# Patient Record
Sex: Male | Born: 1940 | Race: White | Hispanic: No | Marital: Married | Smoking: Current every day smoker
Health system: Southern US, Community
[De-identification: ages and names within clinical notes are randomized; demographics above are authoritative.]

## PROBLEM LIST (undated history)

## (undated) DIAGNOSIS — F17210 Nicotine dependence, cigarettes, uncomplicated: Secondary | ICD-10-CM

## (undated) DIAGNOSIS — Z972 Presence of dental prosthetic device (complete) (partial): Secondary | ICD-10-CM

## (undated) DIAGNOSIS — F329 Major depressive disorder, single episode, unspecified: Secondary | ICD-10-CM

## (undated) DIAGNOSIS — M549 Dorsalgia, unspecified: Secondary | ICD-10-CM

## (undated) DIAGNOSIS — F419 Anxiety disorder, unspecified: Secondary | ICD-10-CM

## (undated) DIAGNOSIS — K219 Gastro-esophageal reflux disease without esophagitis: Secondary | ICD-10-CM

## (undated) DIAGNOSIS — K08109 Complete loss of teeth, unspecified cause, unspecified class: Secondary | ICD-10-CM

## (undated) DIAGNOSIS — M1990 Unspecified osteoarthritis, unspecified site: Secondary | ICD-10-CM

## (undated) DIAGNOSIS — C801 Malignant (primary) neoplasm, unspecified: Secondary | ICD-10-CM

## (undated) DIAGNOSIS — E785 Hyperlipidemia, unspecified: Secondary | ICD-10-CM

## (undated) DIAGNOSIS — F32A Depression, unspecified: Secondary | ICD-10-CM

## (undated) HISTORY — PX: ORIF ANKLE FRACTURE: SUR919

---

## 1984-02-02 HISTORY — PX: BACK SURGERY: SHX140

## 1998-04-04 ENCOUNTER — Encounter: Admission: RE | Admit: 1998-04-04 | Discharge: 1998-07-03 | Payer: Self-pay | Admitting: Anesthesiology

## 1999-02-02 HISTORY — PX: CERVICAL FUSION: SHX112

## 1999-02-02 HISTORY — PX: CARDIAC CATHETERIZATION: SHX172

## 1999-07-02 ENCOUNTER — Encounter: Payer: Self-pay | Admitting: Neurosurgery

## 1999-07-06 ENCOUNTER — Inpatient Hospital Stay (HOSPITAL_COMMUNITY): Admission: RE | Admit: 1999-07-06 | Discharge: 1999-07-07 | Payer: Self-pay | Admitting: Neurosurgery

## 1999-07-06 ENCOUNTER — Encounter: Payer: Self-pay | Admitting: Neurosurgery

## 1999-07-29 ENCOUNTER — Encounter: Admission: RE | Admit: 1999-07-29 | Discharge: 1999-07-29 | Payer: Self-pay | Admitting: Neurosurgery

## 1999-07-29 ENCOUNTER — Encounter: Payer: Self-pay | Admitting: Neurosurgery

## 1999-09-04 ENCOUNTER — Encounter: Payer: Self-pay | Admitting: Neurosurgery

## 1999-09-04 ENCOUNTER — Encounter: Admission: RE | Admit: 1999-09-04 | Discharge: 1999-09-04 | Payer: Self-pay | Admitting: Neurosurgery

## 1999-12-21 ENCOUNTER — Encounter: Admission: RE | Admit: 1999-12-21 | Discharge: 1999-12-21 | Payer: Self-pay | Admitting: Neurosurgery

## 1999-12-21 ENCOUNTER — Encounter: Payer: Self-pay | Admitting: Neurosurgery

## 1999-12-30 ENCOUNTER — Inpatient Hospital Stay (HOSPITAL_COMMUNITY): Admission: EM | Admit: 1999-12-30 | Discharge: 1999-12-31 | Payer: Self-pay | Admitting: Emergency Medicine

## 2000-01-02 ENCOUNTER — Encounter: Payer: Self-pay | Admitting: Neurosurgery

## 2000-01-02 ENCOUNTER — Ambulatory Visit (HOSPITAL_COMMUNITY): Admission: RE | Admit: 2000-01-02 | Discharge: 2000-01-02 | Payer: Self-pay | Admitting: Neurosurgery

## 2000-01-03 ENCOUNTER — Encounter: Payer: Self-pay | Admitting: Neurosurgery

## 2000-03-01 ENCOUNTER — Inpatient Hospital Stay (HOSPITAL_COMMUNITY): Admission: EM | Admit: 2000-03-01 | Discharge: 2000-03-01 | Payer: Self-pay | Admitting: Emergency Medicine

## 2000-03-01 ENCOUNTER — Encounter: Payer: Self-pay | Admitting: Emergency Medicine

## 2001-03-03 ENCOUNTER — Emergency Department (HOSPITAL_COMMUNITY): Admission: EM | Admit: 2001-03-03 | Discharge: 2001-03-03 | Payer: Self-pay

## 2001-09-21 ENCOUNTER — Encounter: Payer: Self-pay | Admitting: Family Medicine

## 2001-09-21 ENCOUNTER — Ambulatory Visit (HOSPITAL_COMMUNITY): Admission: RE | Admit: 2001-09-21 | Discharge: 2001-09-21 | Payer: Self-pay | Admitting: Family Medicine

## 2001-11-17 ENCOUNTER — Ambulatory Visit (HOSPITAL_COMMUNITY): Admission: RE | Admit: 2001-11-17 | Discharge: 2001-11-17 | Payer: Self-pay | Admitting: Family Medicine

## 2001-11-17 ENCOUNTER — Encounter: Payer: Self-pay | Admitting: Family Medicine

## 2001-11-23 ENCOUNTER — Encounter: Payer: Self-pay | Admitting: Family Medicine

## 2001-11-23 ENCOUNTER — Ambulatory Visit (HOSPITAL_COMMUNITY): Admission: RE | Admit: 2001-11-23 | Discharge: 2001-11-23 | Payer: Self-pay | Admitting: Family Medicine

## 2002-04-05 ENCOUNTER — Encounter: Payer: Self-pay | Admitting: Gastroenterology

## 2002-04-05 ENCOUNTER — Ambulatory Visit (HOSPITAL_COMMUNITY): Admission: RE | Admit: 2002-04-05 | Discharge: 2002-04-05 | Payer: Self-pay | Admitting: Gastroenterology

## 2003-08-25 ENCOUNTER — Emergency Department (HOSPITAL_COMMUNITY): Admission: EM | Admit: 2003-08-25 | Discharge: 2003-08-25 | Payer: Self-pay | Admitting: Emergency Medicine

## 2003-08-26 ENCOUNTER — Encounter (HOSPITAL_COMMUNITY): Admission: RE | Admit: 2003-08-26 | Discharge: 2003-09-25 | Payer: Self-pay | Admitting: Orthopaedic Surgery

## 2004-02-28 ENCOUNTER — Ambulatory Visit (HOSPITAL_COMMUNITY): Admission: RE | Admit: 2004-02-28 | Discharge: 2004-02-28 | Payer: Self-pay | Admitting: Preventative Medicine

## 2004-10-30 ENCOUNTER — Ambulatory Visit (HOSPITAL_COMMUNITY): Admission: RE | Admit: 2004-10-30 | Discharge: 2004-10-30 | Payer: Self-pay | Admitting: Family Medicine

## 2008-05-06 ENCOUNTER — Emergency Department (HOSPITAL_COMMUNITY): Admission: EM | Admit: 2008-05-06 | Discharge: 2008-05-06 | Payer: Self-pay | Admitting: Emergency Medicine

## 2010-02-21 ENCOUNTER — Encounter: Payer: Self-pay | Admitting: Preventative Medicine
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
## 2010-06-19 NOTE — Cardiovascular Report (Signed)
[place]. [facility]  Patient:    Craig Green, Craig Green                     MRN: [id number] Proc. Date: 12/31/99 Adm. Date:  [id number] Attending:  Berry, Jonathan [place] CC:         [facility]  Gareth Morgan, M.D., [place], [place]  [facility], [phone] [place]. [address] [place], [place] [address]   Cardiac Catheterization  CLINICAL HISTORY:  [name] is a 70 year old married white male, father of two, grandfather to two.  He is referred by Dr. Sudie Bailey for chest pain on December 28, 1999.  The patients profile is positive for a 45 pack/year tobacco abuse, but otherwise is negative.  He works Chemical engineer houses.  He describes left precordial pain radiating to his left arm, for approximately five to six months.  He was scheduled to have a Cardiolite stress test while he was seen in the [facility] Emergency Room last night; was transferred ______ for further evaluation.  He presents now for diagnostic coronary arteriography.  DESCRIPTION OF PROCEDURE:  The patient was brought to the second floor [place]. [facility] cardiac catheterization lab in the postabsorptive state.  He was premedicated with p.o. Valium and IV Versed. His right groin was prepped and shaved in the usual sterile fashion. Xylocaine 1% was used for local anesthesia.  A 6-French sheath was inserted into the right femoral artery using standard Seldinger technique. Then 6-French right and left Judkins diagnostic catheters, as well as a 6-French pigtail catheter were used for selective coronary angiography and left ventriculography respectively.  Omnipaque dye was used throughout the entirety of the case.  Retrograde aortic and left ventricular pullback pressures were recorded.  HEMODYNAMICS: 1. Aortic pressure:  130/79. 2. Left ventricular pressure:  131/18.  SELECTIVE CORONARY ANGIOGRAPHY: 1. LEFT MAIN:  Normal. 2. LEFT  ANTERIOR DESCENDING ARTERY:  Normal. 3. LEFT CIRCUMFLEX:  Normal. 4. RIGHT CORONARY ARTERY:  Dominant and normal.  LEFT VENTRICULOGRAPHY:  RAO left ventriculogram was performed using 25 cc of Omnipaque dye at 12 cc/sec.  The overall LVEF, calculated by the foreshortening technique and automated edge detection algorithms, was 64%.  No focal wall motion abnormalities.  IMPRESSION:  [name] has normal coronary arteries and normal left ventricular function.  His right femoral arterial puncture site was hemostatically sealed with a Perclose device, after angiographically assessing the puncture site to be well above the ______ profunda bifurcation. Hemostasis was obtained.  The patient left the lab in stable condition. Dr. Gareth Morgan was notified of these results.  The patient will be discharged home later today and will follow up with me in the office in one week for groin check. DD:  12/31/99 TD:  12/31/99 Job: [id number]

## 2010-06-19 NOTE — Op Note (Signed)
[place]. [facility]  Patient:    Craig Green, Craig Green                     MRN: [id number] Proc. Date: 07/06/99 Adm. Date:  [id number] Disc. Date: [id number] Attending:  Barton Fanny CC:         Hewitt Shorts, M.D.                           Operative Report  PREOPERATIVE DIAGNOSIS:  C3-4 cervical degenerative disk disease, spondylosis and disk herniation with resulting radiculopathy.  POSTOPERATIVE DIAGNOSIS:  C3-4 cervical degenerative disk disease, spondylosis and disk herniation with resulting radiculopathy.  OPERATION PERFORMED:  C3-4 anterior cervical diskectomy and arthrodesis with iliac crest allograft and Synthes cervical plating.  SURGEON:  Hewitt Shorts, M.D.  ASSISTANT:  Alanson Aly. Roxan Hockey, M.D.  ANESTHESIA:  General endotracheal.  INDICATIONS FOR PROCEDURE:  The patient is a 70 year old man who presented with right cervical radiculopathy, who was found to have degenerative changes at C3-4, worse than at C6-7 and it was felt that most likely the majority of the symptoms were arising from the C3-4 level and therefore, decision was made to proceed with a single level anterior cervical diskectomy and arthrodesis.  DESCRIPTION OF PROCEDURE:  The patient was brought to the operating room and placed under general endotracheal anesthesia.  The patient was placed in 10 pounds of halter traction and the neck was prepped with Betadine soap and solution and draped in a sterile fashion.  A horizontal incision was made in the left side of the neck.  The line of the incision was infiltrated with local anesthetic with epinephrine.  Incision was made with a Shaw scalpel with a temperature at 120 and dissection was carried down to the subcutaneous tissues and platysma.  Dissection was then carried out through an avascular plane leaving the sternocleidomastoid, carotid artery and jugular vein laterally and trachea and esophagus medially.  The  ventral aspect of the vertebral column was identified and localizing x-ray taken.  The C3-4 intervertebral disk space was identified.  Diskectomy was begun with an incision of the annulus and continued with microcurets and pituitary rongeurs. The cartilaginous end plates of the C3 and C4 vertebrae were removed using a Midas Rex drill with an A-2 bur and then the microscope was draped and brought into the field to provide additional magnification, illumination and visualization.  The remainder of the procedure was performed using microdissection technique.  There was extensive posterior osteophytic overgrowth that was carefully removed using a Midas Rex drill with an A-2 bur and a Kerrison punch with a thin footplate.  The 2 mm punch was used to remove the remaining osteophytic overgrowth and foraminotomy was performed bilaterally.  The posterior longitudinal ligament was thickened and this was removed as well.  In the end, good decompression of the thecal sac and nerve roots bilaterally was achieved.  We then selected a wedge of iliac crest allograft.  It was cut and shaped using an oscillating saw and placed into the intervertebral disk space.  The height of the graft anteriorly was 8 mm.  The graft was countersunk and anterior osteophytic overgrowth was removed and then we selected a 16 mm Synthes cervical plate.  It was secured to the vertebra with a pair of 4.0 x 16 mm self drilling cancellous screws.  An x-ray was taken, the graft was in good position.  The plate  and screws were in good position and then locking screws were placed.  Once all four locking screws were secured, the wound was irrigated with bacitracin solution and checked for hemostasis and we proceeded with closure.  The platysma was reapproximated with interrupted inverted 2-0 undyed Vicryl sutures, the subcutaneous and subcuticular layer were closed with interrupted inverted 3-0 undyed Vicryl sutures and the skin edges  were approximated with Steri-Strips.  The wound was dressed with adaptic and sterile gauze.  The patient tolerated the procedure well.  Estimated blood loss for this procedure was 100 cc.  Sponge, needle and instrument counts were correct.  Following surgery the patient who had been taken out of traction once the bone graft was placed, was reversed from anesthetic to be extubated and was transferred to recovery room for further care. DD:  07/06/99 TD:  07/08/99 Job: [id number]

## 2012-04-04 ENCOUNTER — Encounter (HOSPITAL_BASED_OUTPATIENT_CLINIC_OR_DEPARTMENT_OTHER): Payer: Self-pay | Admitting: *Deleted

## 2012-04-04 ENCOUNTER — Other Ambulatory Visit: Payer: Self-pay | Admitting: Orthopedic Surgery
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
## 2012-04-05 ENCOUNTER — Ambulatory Visit (HOSPITAL_BASED_OUTPATIENT_CLINIC_OR_DEPARTMENT_OTHER)
Admission: RE | Admit: 2012-04-05 | Discharge: 2012-04-05 | Disposition: A | Discharge status: Home / Self Care | Payer: Medicare Other | Source: Ambulatory Visit | Attending: Orthopedic Surgery | Admitting: Orthopedic Surgery

## 2012-04-05 ENCOUNTER — Ambulatory Visit (HOSPITAL_COMMUNITY): Payer: Medicare Other

## 2012-04-05 ENCOUNTER — Encounter (HOSPITAL_BASED_OUTPATIENT_CLINIC_OR_DEPARTMENT_OTHER)
Admission: RE | Disposition: A | Discharge status: Home / Self Care | Payer: Self-pay | Source: Ambulatory Visit | Attending: Orthopedic Surgery

## 2012-04-05 ENCOUNTER — Encounter (HOSPITAL_BASED_OUTPATIENT_CLINIC_OR_DEPARTMENT_OTHER): Payer: Self-pay | Admitting: Anesthesiology

## 2012-04-05 ENCOUNTER — Ambulatory Visit (HOSPITAL_BASED_OUTPATIENT_CLINIC_OR_DEPARTMENT_OTHER): Payer: Medicare Other | Admitting: Anesthesiology

## 2012-04-05 ENCOUNTER — Encounter (HOSPITAL_BASED_OUTPATIENT_CLINIC_OR_DEPARTMENT_OTHER): Payer: Self-pay | Admitting: *Deleted

## 2012-04-05 DIAGNOSIS — E785 Hyperlipidemia, unspecified: Secondary | ICD-10-CM | POA: Insufficient documentation

## 2012-04-05 DIAGNOSIS — M12549 Traumatic arthropathy, unspecified hand: Secondary | ICD-10-CM | POA: Insufficient documentation

## 2012-04-05 DIAGNOSIS — F17200 Nicotine dependence, unspecified, uncomplicated: Secondary | ICD-10-CM | POA: Insufficient documentation

## 2012-04-05 DIAGNOSIS — F329 Major depressive disorder, single episode, unspecified: Secondary | ICD-10-CM | POA: Insufficient documentation

## 2012-04-05 DIAGNOSIS — F411 Generalized anxiety disorder: Secondary | ICD-10-CM | POA: Insufficient documentation

## 2012-04-05 DIAGNOSIS — F3289 Other specified depressive episodes: Secondary | ICD-10-CM | POA: Insufficient documentation

## 2012-04-05 DIAGNOSIS — K219 Gastro-esophageal reflux disease without esophagitis: Secondary | ICD-10-CM | POA: Insufficient documentation

## 2012-04-05 HISTORY — DX: Gastro-esophageal reflux disease without esophagitis: K21.9

## 2012-04-05 HISTORY — DX: Anxiety disorder, unspecified: F41.9

## 2012-04-05 HISTORY — DX: Unspecified osteoarthritis, unspecified site: M19.90

## 2012-04-05 HISTORY — DX: Hyperlipidemia, unspecified: E78.5

## 2012-04-05 HISTORY — DX: Major depressive disorder, single episode, unspecified: F32.9

## 2012-04-05 HISTORY — DX: Complete loss of teeth, unspecified cause, unspecified class: K08.109

## 2012-04-05 HISTORY — DX: Nicotine dependence, cigarettes, uncomplicated: F17.210

## 2012-04-05 HISTORY — DX: Depression, unspecified: F32.A

## 2012-04-05 HISTORY — PX: FINGER ARTHRODESIS: SHX5000

## 2012-04-05 HISTORY — DX: Presence of dental prosthetic device (complete) (partial): Z97.2

## 2012-04-05 SURGERY — FUSION, JOINT, FINGER
Anesthesia: Regional | Site: Thumb | Laterality: Right | Wound class: Clean

## 2012-04-05 MED ORDER — LIDOCAINE HCL (CARDIAC) 20 MG/ML IV SOLN
INTRAVENOUS | Status: DC | PRN
  Administered 2012-04-05: 50 mg via INTRAVENOUS

## 2012-04-05 MED ORDER — LACTATED RINGERS IV SOLN
INTRAVENOUS | Status: DC
Start: 2012-04-05 — End: 2012-04-05
  Administered 2012-04-05 (×3): via INTRAVENOUS

## 2012-04-05 MED ORDER — CEFAZOLIN SODIUM-DEXTROSE 2-3 GM-% IV SOLR
INTRAVENOUS | Status: DC | PRN
  Administered 2012-04-05: 2 g via INTRAVENOUS

## 2012-04-05 MED ORDER — ONDANSETRON HCL 4 MG/2ML IJ SOLN
INTRAMUSCULAR | Status: DC | PRN
  Administered 2012-04-05: 4 mg via INTRAVENOUS

## 2012-04-05 MED ORDER — OXYCODONE HCL 5 MG PO TABS: 5.0000 mg | ORAL_TABLET | Freq: Once | ORAL | Status: DC | PRN

## 2012-04-05 MED ORDER — HYDROMORPHONE HCL PF 1 MG/ML IJ SOLN: 0.2500 mg | INTRAMUSCULAR | Status: DC | PRN

## 2012-04-05 MED ORDER — MIDAZOLAM HCL 2 MG/2ML IJ SOLN: 1.0000 mg | INTRAMUSCULAR | Status: DC | PRN

## 2012-04-05 MED ORDER — FENTANYL CITRATE 0.05 MG/ML IJ SOLN
50.0000 ug | INTRAMUSCULAR | Status: DC | PRN
  Administered 2012-04-05: 50 ug via INTRAVENOUS

## 2012-04-05 MED ORDER — HYDROCODONE-ACETAMINOPHEN 5-325 MG PO TABS: 1.0000 | ORAL_TABLET | ORAL | Status: DC | PRN

## 2012-04-05 MED ORDER — DEXAMETHASONE SODIUM PHOSPHATE 4 MG/ML IJ SOLN
INTRAMUSCULAR | Status: DC | PRN
  Administered 2012-04-05: 10 mg via INTRAVENOUS

## 2012-04-05 MED ORDER — BUPIVACAINE HCL (PF) 0.5 % IJ SOLN
INTRAMUSCULAR | Status: DC | PRN
  Administered 2012-04-05: 10 mL

## 2012-04-05 MED ORDER — BUPIVACAINE-EPINEPHRINE PF 0.5-1:200000 % IJ SOLN
INTRAMUSCULAR | Status: DC | PRN
  Administered 2012-04-05: 30 mL

## 2012-04-05 MED ORDER — PROPOFOL 10 MG/ML IV BOLUS
INTRAVENOUS | Status: DC | PRN
  Administered 2012-04-05: 200 mg via INTRAVENOUS

## 2012-04-05 MED ORDER — OXYCODONE-ACETAMINOPHEN 5-325 MG PO TABS: 1.0000 | ORAL_TABLET | ORAL | Status: DC | PRN

## 2012-04-05 MED ORDER — OXYCODONE HCL 5 MG/5ML PO SOLN: 5.0000 mg | Freq: Once | ORAL | Status: DC | PRN

## 2012-04-05 MED ORDER — HYDROMORPHONE HCL PF 1 MG/ML IJ SOLN: 0.5000 mg | INTRAMUSCULAR | Status: DC | PRN

## 2012-04-05 SURGICAL SUPPLY — 55 items
BANDAGE COBAN STERILE 2 (GAUZE/BANDAGES/DRESSINGS) IMPLANT
BANDAGE GAUZE ELAST BULKY 4 IN (GAUZE/BANDAGES/DRESSINGS) ×4 IMPLANT
BIT DRILL LNG QR ACUTRAK 2 (BIT) ×1 IMPLANT
BIT DRILL MINI LNG ACUTRAK 2 (BIT) IMPLANT
BLADE AVERAGE 25X9 (BLADE) ×1 IMPLANT
BLADE MINI RND TIP GREEN BEAV (BLADE) ×1 IMPLANT
BLADE OSC/SAG .038X5.5 CUT EDG (BLADE) IMPLANT
BLADE SURG 15 STRL LF DISP TIS (BLADE) ×1 IMPLANT
BLADE SURG 15 STRL SS (BLADE) ×4
BNDG CMPR 9X4 STRL LF SNTH (GAUZE/BANDAGES/DRESSINGS) ×1
BNDG COHESIVE 3X5 TAN STRL LF (GAUZE/BANDAGES/DRESSINGS) ×1 IMPLANT
BNDG COHESIVE 4X5 TAN STRL (GAUZE/BANDAGES/DRESSINGS) IMPLANT
BNDG ESMARK 4X9 LF (GAUZE/BANDAGES/DRESSINGS) ×1 IMPLANT
CHLORAPREP W/TINT 26ML (MISCELLANEOUS) ×2 IMPLANT
CLOTH BEACON ORANGE TIMEOUT ST (SAFETY) ×2 IMPLANT
CORDS BIPOLAR (ELECTRODE) ×2 IMPLANT
COVER MAYO STAND STRL (DRAPES) ×2 IMPLANT
COVER TABLE BACK 60X90 (DRAPES) ×2 IMPLANT
CUFF TOURNIQUET SINGLE 18IN (TOURNIQUET CUFF) ×1 IMPLANT
DRAPE C-ARM 42X72 X-RAY (DRAPES) ×1 IMPLANT
DRAPE EXTREMITY T 121X128X90 (DRAPE) ×2 IMPLANT
DRAPE SURG 17X23 STRL (DRAPES) ×2 IMPLANT
DRILL MINI LNG ACUTRAK 2 (BIT) ×2
DRSG EMULSION OIL 3X3 NADH (GAUZE/BANDAGES/DRESSINGS) ×2 IMPLANT
GLOVE BIO SURGEON STRL SZ 6.5 (GLOVE) ×1 IMPLANT
GLOVE BIO SURGEON STRL SZ7.5 (GLOVE) ×2 IMPLANT
GLOVE BIOGEL PI IND STRL 8 (GLOVE) ×1 IMPLANT
GLOVE BIOGEL PI INDICATOR 8 (GLOVE) ×1
GOWN PREVENTION PLUS XLARGE (GOWN DISPOSABLE) ×4 IMPLANT
GUIDEWIRE ORTHO 0.054X7 SS (WIRE) ×1 IMPLANT
GUIDEWIRE ORTHO MINI ACTK .045 (WIRE) ×1 IMPLANT
KWIRE 4.0 X .062IN (WIRE) IMPLANT
NDL HYPO 25X1 1.5 SAFETY (NEEDLE) IMPLANT
NEEDLE HYPO 25X1 1.5 SAFETY (NEEDLE) IMPLANT
NS IRRIG 1000ML POUR BTL (IV SOLUTION) ×2 IMPLANT
PACK BASIN DAY SURGERY FS (CUSTOM PROCEDURE TRAY) ×2 IMPLANT
PAD CAST 4YDX4 CTTN HI CHSV (CAST SUPPLIES) ×1 IMPLANT
PADDING CAST ABS 4INX4YD NS (CAST SUPPLIES) ×1
PADDING CAST ABS COTTON 4X4 ST (CAST SUPPLIES) IMPLANT
PADDING CAST COTTON 4X4 STRL (CAST SUPPLIES) ×2
RUBBERBAND STERILE (MISCELLANEOUS) IMPLANT
SCREW ACUTRAK 2 MINI 20MM (Screw) ×1 IMPLANT
SCREW ACUTRAK 2 STD 34MM (Screw) ×1 IMPLANT
SLING ARM FOAM STRAP LRG (SOFTGOODS) ×1 IMPLANT
SPLINT PLASTER CAST XFAST 3X15 (CAST SUPPLIES) IMPLANT
SPLINT PLASTER XTRA FASTSET 3X (CAST SUPPLIES)
SPONGE GAUZE 4X4 12PLY (GAUZE/BANDAGES/DRESSINGS) ×2 IMPLANT
STOCKINETTE 4X48 STRL (DRAPES) ×2 IMPLANT
SUT ETHIBOND 3-0 V-5 (SUTURE) IMPLANT
SUT STEEL 4 (SUTURE) IMPLANT
SYR BULB 3OZ (MISCELLANEOUS) ×2 IMPLANT
SYRINGE 10CC LL (SYRINGE) IMPLANT
TOWEL OR 17X24 6PK STRL BLUE (TOWEL DISPOSABLE) ×2 IMPLANT
TOWEL OR NON WOVEN STRL DISP B (DISPOSABLE) ×2 IMPLANT
UNDERPAD 30X30 INCONTINENT (UNDERPADS AND DIAPERS) ×2 IMPLANT
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
## 2012-04-05 NOTE — Anesthesia Procedure Notes (Addendum)
Anesthesia Regional Block:  Supraclavicular block  Pre-Anesthetic Checklist: ,, timeout performed, Correct Patient, Correct Site, Correct Laterality, Correct Procedure, Correct Position, site marked, Risks and benefits discussed, pre-op evaluation, post-op pain management  Laterality: Right  Prep: Maximum Sterile Barrier Precautions used and chloraprep       Needles:  Injection technique: Single-shot  Needle Type: Echogenic Stimulator Needle     Needle Length: 5cm 5 cm Needle Gauge: 22 and 22 G    Additional Needles:  Procedures: ultrasound guided (picture in chart) Supraclavicular block Narrative:  Start time: 04/05/2012 8:03 AM End time: 04/05/2012 8:12 AM Injection made incrementally with aspirations every 5 mL. Anesthesiologist: Fitzgerald,MD  Additional Notes: 2% Lidocaine skin wheel. Intercostobrachial block with 10cc of 0.5% Bupivicaine plain.  Supraclavicular block Procedure Name: LMA Insertion Date/Time: 04/05/2012 8:34 AM Performed by: Gar Gibbon Pre-anesthesia Checklist: Patient identified, Emergency Drugs available, Suction available and Patient being monitored Patient Re-evaluated:Patient Re-evaluated prior to inductionOxygen Delivery Method: Circle System Utilized Preoxygenation: Pre-oxygenation with 100% oxygen Intubation Type: IV induction Ventilation: Mask ventilation without difficulty LMA: LMA inserted LMA Size: 4.0 Number of attempts: 1 Airway Equipment and Method: bite block Placement Confirmation: positive ETCO2 Tube secured with: Tape Dental Injury: Teeth and Oropharynx as per pre-operative assessment

## 2012-04-05 NOTE — Transfer of Care (Signed)
Immediate Anesthesia Transfer of Care Note  Patient: Craig Green  Procedure(s) Performed: Procedure(s): RIGHT THUMB METACARPOPHALANGEAL ARTHRODESIS  (Right)  Patient Location: PACU  Anesthesia Type:GA combined with regional for post-op pain  Level of Consciousness: awake, oriented and patient cooperative  Airway & Oxygen Therapy: Patient Spontanous Breathing and Patient connected to face mask oxygen  Post-op Assessment: Report given to PACU RN and Post -op Vital signs reviewed and stable  Post vital signs: Reviewed and stable  Complications: No apparent anesthesia complications

## 2012-04-05 NOTE — Op Note (Signed)
04/05/2012  7:06 AM  PATIENT:  Craig Green  72 y.o. male  PRE-OPERATIVE DIAGNOSIS:  Severe Right thumb MPJ Post-traumatic arthritis  POST-OPERATIVE DIAGNOSIS:  Same  PROCEDURE:  RIght thumb MPJ arthrodesis  SURGEON: Cliffton Asters. Janee Morn, MD  PHYSICIAN ASSISTANT: None  ANESTHESIA:  regional and general  SPECIMENS:  None  DRAINS:   None  PREOPERATIVE INDICATIONS:  Craig Green is a  72 y.o. male with a diagnosis of severe right thumb MPJ posttraumatic arthritis who failed conservative measures and elected for surgical management.    The risks benefits and alternatives were discussed with the patient preoperatively including but not limited to the risks of infection, bleeding, nerve injury, cardiopulmonary complications, the need for revision surgery, among others, and the patient verbalized understanding and consented to proceed.  OPERATIVE IMPLANTS: 1 mini Acutrak screw, length 30, and one standard Acutrak screw, length 34  OPERATIVE FINDINGS: Severe posttraumatic arthritis of the right thumb MPJ with volar and ulnar subluxation of the proximal phalanx  OPERATIVE PROCEDURE:  After receiving a regional block, the patient was escorted to the operative theatre and placed in a supine position.  General anesthesia was a Optician, dispensing. A surgical "time-out" was performed during which the planned procedure, proposed operative site, and the correct patient identity were compared to the operative consent and agreement confirmed by the circulating nurse according to current facility policy.  Prophylactic antibodies were administered.  Following application of a tourniquet to the operative extremity, the exposed skin was prepped with Chloraprep and draped in the usual sterile fashion.  The limb was exsanguinated with an Esmarch bandage and the tourniquet inflated to approximately higher than systolic BP.  A 2 limb zigzag incision was marked over the dorsum of the MP joint with its base  ulnarly and an incision was made sharply with a scalpel. The flap was elevated with care to protect and preserve the crossing cutaneous nerve structures. 1 more easily identifiable cutaneous nerve was rather large and retracted radially. The dorsal hood was split longitudinally on the radial side of the EPB tendon. The apparatus was reflected ulnarly. The joint capsule was entered longitudinally and flaps elevated subperiosteally radially and ulnarly. Osteophytes about the head of the metacarpal were resected with a rongeur. The collaterals were freed from the metacarpal and the volar plate split. This allowed for reduction of the joint. The joint surfaces were then shaped with a saw with care to use dripping saline to reduce dermal insult. Once the bone was adequately shaped, it was again copiously irrigated and the surfaces reapproximated. The attempt was made to place diffusion in very slight flexion of 10 to 15. In a position of fusion the thumb tip rested on the index and long fingertips. This was secured with a guidepin from the standard Acutrak screw set. A second guidepin was placed in a crossing fashion. It was from the mini Acutrak set. The appropriate length was measured and a #30 the Accu-Chek screw was placed. Over the first K wire, it was measured to be 3434 length standard Acutrak screw was placed. Apposition at the arthrodesis site seemed extremely tight and it was very stable.  Final fluoroscopic images were obtained. The wound is irrigated and the extensor apparatus was reapproximated with a running 3-0 Ethibond suture. Tourniquet was released and additional hemostasis obtained with direct pressure and bipolar cautery and the skin was closed with 4-0 Vicryl Rapide horizontal mattress suture. A splint dressing with a thumb spica plaster component hallway to the  thumb tip was applied and the patient was awakened and taken to recovery in stable condition, breathing spontaneously.  DISPOSITION:  Patient was discharged with a prescription for oxycodone 5 mg tabs, 1-2 tabs every 4 hours prn quantity 80. Patient will be discharged home today with typical postop instructions returning in 10-15 days for reevaluation to include x-rays of the right thumb out of the splint and conversion to a short arm thumb spica cast all the way to the thumb tip.

## 2012-04-05 NOTE — Progress Notes (Signed)
Assisted Dr. Fitzgerald with right, ultrasound guided, supraclavicular block. Side rails up, monitors on throughout procedure. See vital signs in flow sheet. Tolerated Procedure well. 

## 2012-04-05 NOTE — Anesthesia Postprocedure Evaluation (Signed)
  Anesthesia Post-op Note  Patient: Craig Green  Procedure(s) Performed: Procedure(s): RIGHT THUMB METACARPOPHALANGEAL ARTHRODESIS  (Right)  Patient Location: PACU  Anesthesia Type:GA combined with regional for post-op pain  Level of Consciousness: awake and alert   Airway and Oxygen Therapy: Patient Spontanous Breathing  Post-op Pain: none  Post-op Assessment: Post-op Vital signs reviewed, Patient's Cardiovascular Status Stable, Respiratory Function Stable, Patent Airway and No signs of Nausea or vomiting  Post-op Vital Signs: Reviewed and stable  Complications: No apparent anesthesia complications

## 2012-04-05 NOTE — Anesthesia Preprocedure Evaluation (Addendum)
Anesthesia Evaluation  Patient identified by MRN, date of birth, ID band Patient awake    Reviewed: Allergy & Precautions, H&P , NPO status , Patient's Chart, lab work & pertinent test results  Airway Mallampati: III TM Distance: >3 FB Neck ROM: Full    Dental no notable dental hx. (+) Edentulous Upper, Edentulous Lower and Dental Advisory Given   Pulmonary neg pulmonary ROS,  breath sounds clear to auscultation  Pulmonary exam normal       Cardiovascular negative cardio ROS  Rhythm:Regular Rate:Normal     Neuro/Psych negative neurological ROS  negative psych ROS   GI/Hepatic Neg liver ROS, GERD-  Medicated and Controlled,  Endo/Other  negative endocrine ROS  Renal/GU negative Renal ROS  negative genitourinary   Musculoskeletal   Abdominal   Peds  Hematology negative hematology ROS (+)   Anesthesia Other Findings   Reproductive/Obstetrics negative OB ROS                          Anesthesia Physical Anesthesia Plan  ASA: II  Anesthesia Plan: General and Regional   Post-op Pain Management:    Induction: Intravenous  Airway Management Planned: LMA  Additional Equipment:   Intra-op Plan:   Post-operative Plan: Extubation in OR  Informed Consent: I have reviewed the patients History and Physical, chart, labs and discussed the procedure including the risks, benefits and alternatives for the proposed anesthesia with the patient or authorized representative who has indicated his/her understanding and acceptance.   Dental advisory given  Plan Discussed with: CRNA and Surgeon  Anesthesia Plan Comments:        Anesthesia Quick Evaluation

## 2012-04-06 ENCOUNTER — Encounter (HOSPITAL_BASED_OUTPATIENT_CLINIC_OR_DEPARTMENT_OTHER): Payer: Self-pay | Admitting: Orthopedic Surgery

## 2013-04-11 ENCOUNTER — Encounter (HOSPITAL_COMMUNITY): Payer: Self-pay

## 2013-04-11 ENCOUNTER — Encounter (HOSPITAL_COMMUNITY)
Admission: RE | Admit: 2013-04-11 | Discharge: 2013-04-11 | Disposition: A | Discharge status: Home / Self Care | Payer: Medicare PPO | Source: Ambulatory Visit | Attending: Urology | Admitting: Urology

## 2013-04-11 ENCOUNTER — Other Ambulatory Visit: Payer: Self-pay

## 2013-04-11 DIAGNOSIS — Z01812 Encounter for preprocedural laboratory examination: Secondary | ICD-10-CM | POA: Insufficient documentation

## 2013-04-11 DIAGNOSIS — Z01810 Encounter for preprocedural cardiovascular examination: Secondary | ICD-10-CM | POA: Insufficient documentation

## 2013-04-11 LAB — BASIC METABOLIC PANEL
BUN: 13 mg/dL (ref 6–23)
CHLORIDE: 102 meq/L (ref 96–112)
CO2: 26 mEq/L (ref 19–32)
Calcium: 9.7 mg/dL (ref 8.4–10.5)
Creatinine, Ser: 0.67 mg/dL (ref 0.50–1.35)
GLUCOSE: 104 mg/dL — AB (ref 70–99)
POTASSIUM: 4.9 meq/L (ref 3.7–5.3)
SODIUM: 139 meq/L (ref 137–147)

## 2013-04-11 LAB — HEMOGLOBIN AND HEMATOCRIT, BLOOD
HCT: 40.5 % (ref 39.0–52.0)
HEMOGLOBIN: 14 g/dL (ref 13.0–17.0)

## 2013-04-11 NOTE — Patient Instructions (Signed)
LUISCARLOS KACZMARCZYK  04/11/2013   Your procedure is scheduled on:  04/12/2013  Report to [facility] at  800  AM.  Call this number if you have problems the morning of surgery: [phone]   Remember:   Do not eat food or drink liquids after midnight.   Take these medicines the morning of surgery with A SIP OF WATER:  Xanax, prozac, prilosec   Do not wear jewelry, make-up or nail polish.  Do not wear lotions, powders, or perfumes.   Do not shave 48 hours prior to surgery. Men may shave face and neck.  Do not bring valuables to the hospital.  [facility] is not responsible for any belongings or valuables.               Contacts, dentures or bridgework may not be worn into surgery.  Leave suitcase in the car. After surgery it may be brought to your room.  For patients admitted to the hospital, discharge time is determined by your  treatment team.               Patients discharged the day of surgery will not be allowed to drive home.  Name and phone number of your driver: family  Special Instructions: Shower using CHG 2 nights before surgery and the night before surgery.  If you shower the day of surgery use CHG.  Use special wash - you have one bottle of CHG for all showers.  You should use approximately 1/3 of the bottle for each shower.   Please read over the following fact sheets that you were given: Pain Booklet, Coughing and Deep Breathing, Surgical Site Infection Prevention, Anesthesia Post-op Instructions and Care and Recovery After Surgery Biopsy Care After These instructions give you information on caring for yourself after your procedure. Your doctor may also give you more specific instructions. Call your doctor if you have any problems or questions after your procedure. HOME CARE   Return to your normal diet and activities as told by your doctor.  Change your bandages (dressings) as told by your doctor. If skin glue (adhesive) was used, it will peel off in 7  days.  Only take medicines as told by your doctor.  Ask your doctor when you can bathe and get your wound wet. GET HELP RIGHT AWAY IF:  You see more than a small spot of blood coming from the wound.  You have redness, puffiness (swelling), or pain.  You see yellowish-white fluid (pus) coming from the wound.  You have a fever.  You notice a bad smell coming from the wound or bandage.  You have a rash, trouble breathing, or any allergy problems. MAKE SURE YOU:   Understand these instructions.  Will watch your condition.  Will get help right away if you are not doing well or get worse. Document Released: 09/22/2010 Document Revised: 04/12/2011 Document Reviewed: 09/22/2010 [facility] Patient Information 2014 [place], [place]. PATIENT INSTRUCTIONS POST-ANESTHESIA  IMMEDIATELY FOLLOWING SURGERY:  Do not drive or operate machinery for the first twenty four hours after surgery.  Do not make any important decisions for twenty four hours after surgery or while taking narcotic pain medications or sedatives.  If you develop intractable nausea and vomiting or a severe headache please notify your doctor immediately.  FOLLOW-UP:  Please make an appointment with your surgeon as instructed. You do not need to follow up with anesthesia unless specifically instructed to do so.  WOUND CARE INSTRUCTIONS (if  applicable):  Keep a dry clean dressing on the anesthesia/puncture wound site if there is drainage.  Once the wound has quit draining you may leave it open to air.  Generally you should leave the bandage intact for twenty four hours unless there is drainage.  If the epidural site drains for more than 36-48 hours please call the anesthesia department.  QUESTIONS?:  Please feel free to call your physician or the hospital operator if you have any questions, and they will be happy to assist you.

## 2013-04-11 NOTE — Pre-Procedure Instructions (Signed)
Patient given instructions to set up my chart at home.

## 2013-04-12 ENCOUNTER — Ambulatory Visit (HOSPITAL_COMMUNITY): Payer: Medicare PPO | Admitting: Anesthesiology

## 2013-04-12 ENCOUNTER — Encounter (HOSPITAL_COMMUNITY): Payer: Self-pay | Admitting: *Deleted

## 2013-04-12 ENCOUNTER — Ambulatory Visit (HOSPITAL_COMMUNITY)
Admission: RE | Admit: 2013-04-12 | Discharge: 2013-04-12 | Disposition: A | Discharge status: Home / Self Care | Payer: Medicare PPO | Source: Ambulatory Visit | Attending: Urology | Admitting: Urology

## 2013-04-12 ENCOUNTER — Encounter (HOSPITAL_COMMUNITY): Payer: Medicare PPO | Admitting: Anesthesiology

## 2013-04-12 ENCOUNTER — Encounter (HOSPITAL_COMMUNITY)
Admission: RE | Disposition: A | Discharge status: Home / Self Care | Payer: Self-pay | Source: Ambulatory Visit | Attending: Urology

## 2013-04-12 ENCOUNTER — Ambulatory Visit: Admit: 2013-04-12 | Payer: Self-pay | Admitting: Urology

## 2013-04-12 DIAGNOSIS — J449 Chronic obstructive pulmonary disease, unspecified: Secondary | ICD-10-CM | POA: Insufficient documentation

## 2013-04-12 DIAGNOSIS — C609 Malignant neoplasm of penis, unspecified: Secondary | ICD-10-CM | POA: Insufficient documentation

## 2013-04-12 DIAGNOSIS — J4489 Other specified chronic obstructive pulmonary disease: Secondary | ICD-10-CM | POA: Insufficient documentation

## 2013-04-12 DIAGNOSIS — B079 Viral wart, unspecified: Secondary | ICD-10-CM | POA: Insufficient documentation

## 2013-04-12 DIAGNOSIS — Z7982 Long term (current) use of aspirin: Secondary | ICD-10-CM | POA: Insufficient documentation

## 2013-04-12 DIAGNOSIS — F17200 Nicotine dependence, unspecified, uncomplicated: Secondary | ICD-10-CM | POA: Insufficient documentation

## 2013-04-12 DIAGNOSIS — L821 Other seborrheic keratosis: Secondary | ICD-10-CM | POA: Insufficient documentation

## 2013-04-12 DIAGNOSIS — F411 Generalized anxiety disorder: Secondary | ICD-10-CM | POA: Insufficient documentation

## 2013-04-12 DIAGNOSIS — Z79899 Other long term (current) drug therapy: Secondary | ICD-10-CM | POA: Insufficient documentation

## 2013-04-12 HISTORY — PX: PENILE BIOPSY: SHX6013

## 2013-04-12 HISTORY — PX: LESION REMOVAL: SHX5196

## 2013-04-12 SURGERY — BIOPSY, PENIS
Anesthesia: Monitor Anesthesia Care | Site: Penis

## 2013-04-12 SURGERY — BIOPSY, PENIS
Anesthesia: Choice

## 2013-04-12 MED ORDER — LACTATED RINGERS IV SOLN
INTRAVENOUS | Status: DC
  Administered 2013-04-12: 500 mL via INTRAVENOUS
  Administered 2013-04-12: 09:00:00 via INTRAVENOUS

## 2013-04-12 MED ORDER — ONDANSETRON HCL 4 MG/2ML IJ SOLN
4.0000 mg | Freq: Once | INTRAMUSCULAR | Status: AC
  Administered 2013-04-12: 4 mg via INTRAVENOUS

## 2013-04-12 MED ORDER — LIDOCAINE HCL (PF) 1 % IJ SOLN
INTRAMUSCULAR | Status: AC
  Filled 2013-04-12: qty 30

## 2013-04-12 MED ORDER — GLYCOPYRROLATE 0.2 MG/ML IJ SOLN
0.2000 mg | Freq: Once | INTRAMUSCULAR | Status: AC
  Administered 2013-04-12: 0.2 mg via INTRAVENOUS

## 2013-04-12 MED ORDER — BACITRACIN ZINC 500 UNIT/GM EX OINT
TOPICAL_OINTMENT | CUTANEOUS | Status: AC
  Filled 2013-04-12: qty 0.9

## 2013-04-12 MED ORDER — SODIUM CHLORIDE 0.9 % IR SOLN
Status: DC | PRN
  Administered 2013-04-12: 1000 mL

## 2013-04-12 MED ORDER — FENTANYL CITRATE 0.05 MG/ML IJ SOLN
INTRAMUSCULAR | Status: DC | PRN
  Administered 2013-04-12: 25 ug via INTRATHECAL

## 2013-04-12 MED ORDER — FENTANYL CITRATE 0.05 MG/ML IJ SOLN
INTRAMUSCULAR | Status: AC
  Filled 2013-04-12: qty 2

## 2013-04-12 MED ORDER — MIDAZOLAM HCL 5 MG/5ML IJ SOLN
INTRAMUSCULAR | Status: DC | PRN
  Administered 2013-04-12 (×2): 1 mg via INTRAVENOUS

## 2013-04-12 MED ORDER — PROPOFOL 10 MG/ML IV EMUL
INTRAVENOUS | Status: AC
Start: 2013-04-12 — End: 2013-04-12
  Filled 2013-04-12: qty 20

## 2013-04-12 MED ORDER — FENTANYL CITRATE 0.05 MG/ML IJ SOLN: 25.0000 ug | INTRAMUSCULAR | Status: DC | PRN

## 2013-04-12 MED ORDER — MIDAZOLAM HCL 2 MG/2ML IJ SOLN
INTRAMUSCULAR | Status: AC
  Filled 2013-04-12: qty 2

## 2013-04-12 MED ORDER — EPHEDRINE SULFATE 50 MG/ML IJ SOLN
INTRAMUSCULAR | Status: AC
  Filled 2013-04-12: qty 1

## 2013-04-12 MED ORDER — ONDANSETRON HCL 4 MG/2ML IJ SOLN: 4.0000 mg | Freq: Once | INTRAMUSCULAR | Status: DC | PRN

## 2013-04-12 MED ORDER — MIDAZOLAM HCL 2 MG/2ML IJ SOLN
1.0000 mg | INTRAMUSCULAR | Status: DC | PRN
  Administered 2013-04-12 (×2): 2 mg via INTRAVENOUS

## 2013-04-12 MED ORDER — ONDANSETRON HCL 4 MG/2ML IJ SOLN
INTRAMUSCULAR | Status: AC
Start: 2013-04-12 — End: 2013-04-12
  Filled 2013-04-12: qty 2

## 2013-04-12 MED ORDER — LIDOCAINE IN DEXTROSE 5-7.5 % IV SOLN
INTRAVENOUS | Status: AC
  Filled 2013-04-12: qty 2

## 2013-04-12 MED ORDER — GLYCOPYRROLATE 0.2 MG/ML IJ SOLN
INTRAMUSCULAR | Status: AC
  Filled 2013-04-12: qty 1

## 2013-04-12 MED ORDER — LIDOCAINE IN DEXTROSE 5-7.5 % IV SOLN
INTRAVENOUS | Status: DC | PRN
  Administered 2013-04-12: 80 mg via INTRATHECAL

## 2013-04-12 MED ORDER — PROPOFOL INFUSION 10 MG/ML OPTIME
INTRAVENOUS | Status: DC | PRN
  Administered 2013-04-12: 40 ug/kg/min via INTRAVENOUS

## 2013-04-12 MED ORDER — FENTANYL CITRATE 0.05 MG/ML IJ SOLN
25.0000 ug | INTRAMUSCULAR | Status: AC
  Administered 2013-04-12 (×2): 25 ug via INTRAVENOUS

## 2013-04-12 MED ORDER — GLYCOPYRROLATE 0.2 MG/ML IJ SOLN
INTRAMUSCULAR | Status: DC | PRN
  Administered 2013-04-12: 0.2 mg via INTRAVENOUS

## 2013-04-12 MED ORDER — BUPIVACAINE HCL (PF) 0.5 % IJ SOLN
INTRAMUSCULAR | Status: AC
  Filled 2013-04-12: qty 30

## 2013-04-12 MED ORDER — BACITRACIN ZINC 500 UNIT/GM EX OINT
TOPICAL_OINTMENT | CUTANEOUS | Status: DC | PRN
  Administered 2013-04-12: 1 via TOPICAL

## 2013-04-12 MED ORDER — EPHEDRINE SULFATE 50 MG/ML IJ SOLN
INTRAMUSCULAR | Status: DC | PRN
  Administered 2013-04-12: 5 mg via INTRAVENOUS
  Administered 2013-04-12 (×2): 10 mg via INTRAVENOUS

## 2013-04-12 MED ORDER — MIDAZOLAM HCL 2 MG/2ML IJ SOLN
INTRAMUSCULAR | Status: AC
Start: 2013-04-12 — End: 2013-04-12
  Filled 2013-04-12: qty 2

## 2013-04-12 MED ORDER — SODIUM CHLORIDE BACTERIOSTATIC 0.9 % IJ SOLN
INTRAMUSCULAR | Status: AC
  Filled 2013-04-12: qty 10

## 2013-04-12 MED ORDER — FENTANYL CITRATE 0.05 MG/ML IJ SOLN
INTRAMUSCULAR | Status: DC | PRN
  Administered 2013-04-12: 25 ug via INTRAVENOUS

## 2013-04-12 SURGICAL SUPPLY — 42 items
ATCH SMKEVC FLXB CAUT HNDSWH (FILTER) ×1 IMPLANT
BAG HAMPER (MISCELLANEOUS) ×5 IMPLANT
BANDAGE CONFORM 2  STR LF (GAUZE/BANDAGES/DRESSINGS) ×3 IMPLANT
CLOTH BEACON ORANGE TIMEOUT ST (SAFETY) ×5 IMPLANT
COVER LIGHT HANDLE STERIS (MISCELLANEOUS) ×10 IMPLANT
DECANTER SPIKE VIAL GLASS SM (MISCELLANEOUS) ×5 IMPLANT
ELECT NDL TIP 2.8 STRL (NEEDLE) IMPLANT
ELECT NEEDLE TIP 2.8 STRL (NEEDLE) IMPLANT
ELECT REM PT RETURN 9FT ADLT (ELECTROSURGICAL) ×5
ELECTRODE REM PT RTRN 9FT ADLT (ELECTROSURGICAL) ×3 IMPLANT
EVACUATOR SMOKE ACCUVAC VALLEY (FILTER) ×2
FORMALIN 10 PREFIL 120ML (MISCELLANEOUS) ×11 IMPLANT
GAUZE PETROLATUM 1 X8 (GAUZE/BANDAGES/DRESSINGS) ×3 IMPLANT
GLOVE BIO SURGEON STRL SZ7 (GLOVE) ×5 IMPLANT
GLOVE BIOGEL PI IND STRL 7.0 (GLOVE) ×1 IMPLANT
GLOVE BIOGEL PI IND STRL 7.5 (GLOVE) ×1 IMPLANT
GLOVE BIOGEL PI INDICATOR 7.0 (GLOVE) ×2
GLOVE BIOGEL PI INDICATOR 7.5 (GLOVE) ×2
GLOVE SS BIOGEL STRL SZ 6.5 (GLOVE) ×1 IMPLANT
GLOVE SUPERSENSE BIOGEL SZ 6.5 (GLOVE) ×2
GOWN STRL REUS W/TWL LRG LVL3 (GOWN DISPOSABLE) ×10 IMPLANT
KIT ROOM TURNOVER APOR (KITS) ×5 IMPLANT
MANIFOLD NEPTUNE II (INSTRUMENTS) ×5 IMPLANT
NDL HYPO 25X1 1.5 SAFETY (NEEDLE) ×2 IMPLANT
NEEDLE HYPO 25X1 1.5 SAFETY (NEEDLE) ×5 IMPLANT
NS IRRIG 1000ML POUR BTL (IV SOLUTION) ×5 IMPLANT
PACK MINOR (CUSTOM PROCEDURE TRAY) IMPLANT
PAD ARMBOARD 7.5X6 YLW CONV (MISCELLANEOUS) ×5 IMPLANT
PAD TELFA 3X4 1S STER (GAUZE/BANDAGES/DRESSINGS) ×3 IMPLANT
PREFILTER SMOKE EVAC (FILTER) ×3 IMPLANT
SET BASIN LINEN APH (SET/KITS/TRAYS/PACK) ×5 IMPLANT
SOL PREP PROV IODINE SCRUB 4OZ (MISCELLANEOUS) ×5 IMPLANT
SPONGE GAUZE 2X2 8PLY STER LF (GAUZE/BANDAGES/DRESSINGS) ×1
SPONGE GAUZE 2X2 8PLY STRL LF (GAUZE/BANDAGES/DRESSINGS) ×2 IMPLANT
SPONGE INTESTINAL PEANUT (DISPOSABLE) IMPLANT
SPONGE LAP 18X18 X RAY DECT (DISPOSABLE) ×3 IMPLANT
SUT VIC AB 4-0 P-3 18XBRD (SUTURE) ×3 IMPLANT
SUT VIC AB 4-0 P3 18 (SUTURE) ×5
SYR CONTROL 10ML LL (SYRINGE) ×5 IMPLANT
TAPE CLOTH SURG 4X10 WHT LF (GAUZE/BANDAGES/DRESSINGS) ×3 IMPLANT
TOWEL OR 17X26 4PK STRL BLUE (TOWEL DISPOSABLE) ×5 IMPLANT
TUBING SMOKE EVAC CO2 (TUBING) ×3 IMPLANT

## 2013-04-12 NOTE — Progress Notes (Signed)
No change in H&P on reexamination. 

## 2013-04-12 NOTE — Anesthesia Procedure Notes (Signed)
Spinal  Patient location during procedure: OR Start time: 04/12/2013 9:55 AM Staffing CRNA/Resident: Donaldson Richter, [place] Checklist Completed: patient identified, site marked, surgical consent, pre-op evaluation, timeout performed, IV checked, risks and benefits discussed and monitors and equipment checked Spinal Block Patient position: right lateral decubitus Prep: Betadine Patient monitoring: cardiac monitor, heart rate, continuous pulse ox and blood pressure Approach: midline Location: L2-3 Injection technique: single-shot Needle Needle type: Spinocan  Needle gauge: 22 G Needle length: 5 cm Assessment Sensory level: T8 Additional Notes  [id number]       02/2014

## 2013-04-12 NOTE — Brief Op Note (Signed)
04/12/2013  10:55 AM  PATIENT:  Craig Green  73 y.o. 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  DISPOSITION OF SPECIMEN:  PATHOLOGY  COUNTS:  YES  TOURNIQUET:  * No tourniquets in log *  DICTATION: .Other Dictation: Dictation Number note #[id number]  PLAN OF CARE:   PATIENT DISPOSITION:  PACU - hemodynamically stable.   Delay start of Pharmacological VTE agent (>24hrs) due to surgical blood loss or risk of bleeding:

## 2013-04-12 NOTE — Anesthesia Preprocedure Evaluation (Signed)
Anesthesia Evaluation  Patient identified by MRN, date of birth, ID band Patient awake    Reviewed: Allergy & Precautions, H&P , NPO status , Patient's Chart, lab work & pertinent test results  Airway Mallampati: III TM Distance: >3 FB Neck ROM: Full    Dental no notable dental hx. (+) Edentulous Upper, Edentulous Lower, Dental Advisory Given   Pulmonary neg pulmonary ROS, COPDCurrent Smoker,  breath sounds clear to auscultation  Pulmonary exam normal       Cardiovascular negative cardio ROS  Rhythm:Regular Rate:Normal     Neuro/Psych PSYCHIATRIC DISORDERS Anxiety Depression negative neurological ROS  negative psych ROS   GI/Hepatic Neg liver ROS, GERD-  Medicated and Controlled,  Endo/Other  negative endocrine ROS  Renal/GU negative Renal ROS  negative genitourinary   Musculoskeletal   Abdominal   Peds  Hematology negative hematology ROS (+)   Anesthesia Other Findings   Reproductive/Obstetrics negative OB ROS                           Anesthesia Physical Anesthesia Plan  ASA: III  Anesthesia Plan: Spinal and MAC   Post-op Pain Management:    Induction: Intravenous  Airway Management Planned: Nasal Cannula  Additional Equipment:   Intra-op Plan:   Post-operative Plan:   Informed Consent: I have reviewed the patients History and Physical, chart, labs and discussed the procedure including the risks, benefits and alternatives for the proposed anesthesia with the patient or authorized representative who has indicated his/her understanding and acceptance.     Plan Discussed with:   Anesthesia Plan Comments:         Anesthesia Quick Evaluation

## 2013-04-12 NOTE — Transfer of Care (Signed)
Immediate Anesthesia Transfer of Care Note  Patient: Craig Green  Procedure(s) Performed: Procedure(s): PENILE BIOPSY AND FULGURATION (N/A) EXCISION OF ABDOMINAL WART (N/A)  Patient Location: PACU  Anesthesia Type:Spinal  Level of Consciousness: awake and patient cooperative  Airway & Oxygen Therapy: Patient Spontanous Breathing and Patient connected to face mask oxygen  Post-op Assessment: Report given to PACU RN and Post -op Vital signs reviewed and stable  Post vital signs: Reviewed and stable  Complications: No apparent anesthesia complications

## 2013-04-12 NOTE — Anesthesia Postprocedure Evaluation (Signed)
  Anesthesia Post-op Note  Patient: Craig Green  Procedure(s) Performed: Procedure(s): PENILE BIOPSY AND FULGURATION (N/A) EXCISION OF ABDOMINAL WART (N/A)  Patient Location: PACU  Anesthesia Type:Spinal  Level of Consciousness: awake, alert , oriented and patient cooperative  Airway and Oxygen Therapy: Patient Spontanous Breathing  Post-op Pain: 2 /10, mild  Post-op Assessment: Post-op Vital signs reviewed, Patient's Cardiovascular Status Stable, Respiratory Function Stable, Patent Airway, No signs of Nausea or vomiting and Pain level controlled  Post-op Vital Signs: Reviewed and stable  Complications: No apparent anesthesia complications

## 2013-04-12 NOTE — Discharge Instructions (Signed)
Keep sites dry and clean, call if bleeding - [phone] or [phone]. Follow up with Dr. Michela Pitcher 04/19/2013 at 3:45PM.  PATIENT INSTRUCTIONS POST-ANESTHESIA  IMMEDIATELY FOLLOWING SURGERY:  Do not drive or operate machinery for the first twenty four hours after surgery.  Do not make any important decisions for twenty four hours after surgery or while taking narcotic pain medications or sedatives.  If you develop intractable nausea and vomiting or a severe headache please notify your doctor immediately.  FOLLOW-UP:  Please make an appointment with your surgeon as instructed. You do not need to follow up with anesthesia unless specifically instructed to do so.  WOUND CARE INSTRUCTIONS (if applicable):  Keep a dry clean dressing on the anesthesia/puncture wound site if there is drainage.  Once the wound has quit draining you may leave it open to air.  Generally you should leave the bandage intact for twenty four hours unless there is drainage.  If the epidural site drains for more than 36-48 hours please call the anesthesia department.  QUESTIONS?:  Please feel free to call your physician or the hospital operator if you have any questions, and they will be happy to assist you.

## 2013-04-13 ENCOUNTER — Encounter (HOSPITAL_COMMUNITY): Payer: Self-pay | Admitting: Urology

## 2013-04-13 NOTE — Op Note (Signed)
NAMEHOMER, Craig Green              ACCOUNT NO.:  [id number]  MEDICAL RECORD NO.:  [id number]  LOCATION:  [facility]                          FACILITY:  [facility]  PHYSICIAN:  Marissa Nestle, M.D.DATE OF BIRTH:  10/06/1940  DATE OF PROCEDURE:  04/12/2013 DATE OF DISCHARGE:  04/12/2013                              OPERATIVE REPORT   PREOPERATIVE DIAGNOSIS:  Carcinoma in situ of penile skin.  POSTOPERATIVE DIAGNOSES:  Carcinoma in situ of penile skin and wart on abdominal wall skin.  PROCEDURE: 1. Excision of abdominal wart. 2. Excision of penile wall, penile lesion and fulguration of the area.  ANESTHESIA:  Spinal.  DESCRIPTION OF PROCEDURE:  The patient under spinal anesthesia in supine position after usual prep and drape, the wart which was located on the skin of the left lower quadrant was picked with a pickup and elliptical skin incision was given on the base of the wart and it was removed completely.  Then, the base was simply fulgurated and closed with couple of stitches of 4-0 Prolene.  I was then proceeded to excise the penile lesion which was located on the glans penis next to the meatus.  With the help of a sharp knife, the lesion was simply shaved off from the glans penis.  After shaving it off, I cauterized the entire area and skin next to it.  Then after that, I took another biopsy from the base of the excised lesion to make sure there is no underlying cancer and then I fulgurated that area again.  I applied bacitracin ointment to the glans penis and also to the abdominal wart area.  The patient left the operating room in satisfactory condition.     Marissa Nestle, M.D.     MIJ/MEDQ  D:  04/12/2013  T:  04/13/2013  Job:  [id number]

## 2014-03-05 DIAGNOSIS — F419 Anxiety disorder, unspecified: Secondary | ICD-10-CM | POA: Diagnosis not present

## 2014-03-05 DIAGNOSIS — F17210 Nicotine dependence, cigarettes, uncomplicated: Secondary | ICD-10-CM | POA: Diagnosis not present

## 2014-03-05 DIAGNOSIS — Z79891 Long term (current) use of opiate analgesic: Secondary | ICD-10-CM | POA: Diagnosis not present

## 2014-03-05 DIAGNOSIS — M545 Low back pain: Secondary | ICD-10-CM | POA: Diagnosis not present

## 2014-05-08 DIAGNOSIS — N401 Enlarged prostate with lower urinary tract symptoms: Secondary | ICD-10-CM | POA: Diagnosis not present

## 2014-05-08 DIAGNOSIS — R339 Retention of urine, unspecified: Secondary | ICD-10-CM | POA: Diagnosis not present

## 2014-05-08 DIAGNOSIS — C609 Malignant neoplasm of penis, unspecified: Secondary | ICD-10-CM | POA: Diagnosis not present

## 2014-05-08 DIAGNOSIS — D074 Carcinoma in situ of penis: Secondary | ICD-10-CM | POA: Diagnosis not present

## 2014-05-08 DIAGNOSIS — N508 Other specified disorders of male genital organs: Secondary | ICD-10-CM | POA: Diagnosis not present

## 2014-05-08 DIAGNOSIS — R972 Elevated prostate specific antigen [PSA]: Secondary | ICD-10-CM | POA: Diagnosis not present

## 2014-05-08 DIAGNOSIS — M5410 Radiculopathy, site unspecified: Secondary | ICD-10-CM | POA: Diagnosis not present

## 2014-05-08 DIAGNOSIS — N138 Other obstructive and reflux uropathy: Secondary | ICD-10-CM | POA: Diagnosis not present

## 2014-05-30 DIAGNOSIS — D074 Carcinoma in situ of penis: Secondary | ICD-10-CM | POA: Diagnosis not present

## 2014-06-04 DIAGNOSIS — N529 Male erectile dysfunction, unspecified: Secondary | ICD-10-CM | POA: Diagnosis not present

## 2014-06-04 DIAGNOSIS — M545 Low back pain: Secondary | ICD-10-CM | POA: Diagnosis not present

## 2014-06-04 DIAGNOSIS — F419 Anxiety disorder, unspecified: Secondary | ICD-10-CM | POA: Diagnosis not present

## 2014-06-04 DIAGNOSIS — Z79891 Long term (current) use of opiate analgesic: Secondary | ICD-10-CM | POA: Diagnosis not present

## 2014-07-03 DIAGNOSIS — K219 Gastro-esophageal reflux disease without esophagitis: Secondary | ICD-10-CM | POA: Diagnosis not present

## 2014-07-03 DIAGNOSIS — Z87891 Personal history of nicotine dependence: Secondary | ICD-10-CM | POA: Diagnosis not present

## 2014-07-03 DIAGNOSIS — D074 Carcinoma in situ of penis: Secondary | ICD-10-CM | POA: Diagnosis not present

## 2014-07-03 DIAGNOSIS — R339 Retention of urine, unspecified: Secondary | ICD-10-CM | POA: Diagnosis not present

## 2014-07-03 DIAGNOSIS — Z7982 Long term (current) use of aspirin: Secondary | ICD-10-CM | POA: Diagnosis not present

## 2014-07-03 DIAGNOSIS — F329 Major depressive disorder, single episode, unspecified: Secondary | ICD-10-CM | POA: Diagnosis not present

## 2014-07-03 DIAGNOSIS — Z79899 Other long term (current) drug therapy: Secondary | ICD-10-CM | POA: Diagnosis not present

## 2014-07-03 DIAGNOSIS — N401 Enlarged prostate with lower urinary tract symptoms: Secondary | ICD-10-CM | POA: Diagnosis not present

## 2014-07-03 DIAGNOSIS — C399 Malignant neoplasm of lower respiratory tract, part unspecified: Secondary | ICD-10-CM | POA: Diagnosis not present

## 2014-07-03 DIAGNOSIS — R938 Abnormal findings on diagnostic imaging of other specified body structures: Secondary | ICD-10-CM | POA: Diagnosis not present

## 2014-07-03 DIAGNOSIS — N138 Other obstructive and reflux uropathy: Secondary | ICD-10-CM | POA: Diagnosis not present

## 2014-07-03 DIAGNOSIS — Z01818 Encounter for other preprocedural examination: Secondary | ICD-10-CM | POA: Diagnosis not present

## 2014-07-03 DIAGNOSIS — C609 Malignant neoplasm of penis, unspecified: Secondary | ICD-10-CM | POA: Diagnosis not present

## 2014-07-22 DIAGNOSIS — Z7982 Long term (current) use of aspirin: Secondary | ICD-10-CM | POA: Diagnosis not present

## 2014-07-22 DIAGNOSIS — D074 Carcinoma in situ of penis: Secondary | ICD-10-CM | POA: Diagnosis not present

## 2014-07-22 DIAGNOSIS — F419 Anxiety disorder, unspecified: Secondary | ICD-10-CM | POA: Diagnosis not present

## 2014-07-22 DIAGNOSIS — Z87891 Personal history of nicotine dependence: Secondary | ICD-10-CM | POA: Diagnosis not present

## 2014-07-22 DIAGNOSIS — K219 Gastro-esophageal reflux disease without esophagitis: Secondary | ICD-10-CM | POA: Diagnosis not present

## 2014-07-22 DIAGNOSIS — C609 Malignant neoplasm of penis, unspecified: Secondary | ICD-10-CM | POA: Diagnosis not present

## 2014-07-22 DIAGNOSIS — F329 Major depressive disorder, single episode, unspecified: Secondary | ICD-10-CM | POA: Diagnosis not present

## 2014-08-13 DIAGNOSIS — N138 Other obstructive and reflux uropathy: Secondary | ICD-10-CM | POA: Diagnosis not present

## 2014-08-13 DIAGNOSIS — R918 Other nonspecific abnormal finding of lung field: Secondary | ICD-10-CM | POA: Diagnosis not present

## 2014-08-13 DIAGNOSIS — N401 Enlarged prostate with lower urinary tract symptoms: Secondary | ICD-10-CM | POA: Diagnosis not present

## 2014-08-13 DIAGNOSIS — R938 Abnormal findings on diagnostic imaging of other specified body structures: Secondary | ICD-10-CM | POA: Diagnosis not present

## 2014-08-13 DIAGNOSIS — C609 Malignant neoplasm of penis, unspecified: Secondary | ICD-10-CM | POA: Diagnosis not present

## 2014-09-02 DIAGNOSIS — F419 Anxiety disorder, unspecified: Secondary | ICD-10-CM | POA: Diagnosis not present

## 2014-09-02 DIAGNOSIS — M545 Low back pain: Secondary | ICD-10-CM | POA: Diagnosis not present

## 2014-09-02 DIAGNOSIS — Z79891 Long term (current) use of opiate analgesic: Secondary | ICD-10-CM | POA: Diagnosis not present

## 2014-09-02 DIAGNOSIS — G4700 Insomnia, unspecified: Secondary | ICD-10-CM | POA: Diagnosis not present

## 2014-11-13 DIAGNOSIS — N138 Other obstructive and reflux uropathy: Secondary | ICD-10-CM | POA: Diagnosis not present

## 2014-11-13 DIAGNOSIS — N401 Enlarged prostate with lower urinary tract symptoms: Secondary | ICD-10-CM | POA: Diagnosis not present

## 2014-11-13 DIAGNOSIS — C609 Malignant neoplasm of penis, unspecified: Secondary | ICD-10-CM | POA: Diagnosis not present

## 2014-11-13 DIAGNOSIS — R972 Elevated prostate specific antigen [PSA]: Secondary | ICD-10-CM | POA: Diagnosis not present

## 2014-11-13 DIAGNOSIS — R339 Retention of urine, unspecified: Secondary | ICD-10-CM | POA: Diagnosis not present

## 2014-11-13 DIAGNOSIS — N529 Male erectile dysfunction, unspecified: Secondary | ICD-10-CM | POA: Diagnosis not present

## 2014-11-27 DIAGNOSIS — Z79891 Long term (current) use of opiate analgesic: Secondary | ICD-10-CM | POA: Diagnosis not present

## 2014-11-27 DIAGNOSIS — C601 Malignant neoplasm of glans penis: Secondary | ICD-10-CM | POA: Diagnosis not present

## 2014-11-27 DIAGNOSIS — M545 Low back pain: Secondary | ICD-10-CM | POA: Diagnosis not present

## 2014-11-27 DIAGNOSIS — G4700 Insomnia, unspecified: Secondary | ICD-10-CM | POA: Diagnosis not present

## 2015-02-21 DIAGNOSIS — N410 Acute prostatitis: Secondary | ICD-10-CM | POA: Diagnosis not present

## 2015-02-21 DIAGNOSIS — M545 Low back pain: Secondary | ICD-10-CM | POA: Diagnosis not present

## 2015-02-21 DIAGNOSIS — Z79891 Long term (current) use of opiate analgesic: Secondary | ICD-10-CM | POA: Diagnosis not present

## 2015-02-21 DIAGNOSIS — C601 Malignant neoplasm of glans penis: Secondary | ICD-10-CM | POA: Diagnosis not present

## 2016-09-20 ENCOUNTER — Other Ambulatory Visit (HOSPITAL_COMMUNITY): Payer: Self-pay | Admitting: Family Medicine

## 2016-09-20 ENCOUNTER — Ambulatory Visit (HOSPITAL_COMMUNITY)
Admission: RE | Admit: 2016-09-20 | Discharge: 2016-09-20 | Disposition: A | Discharge status: Home / Self Care | Payer: Medicare HMO | Source: Ambulatory Visit | Attending: Family Medicine | Admitting: Family Medicine

## 2016-09-20 DIAGNOSIS — M5136 Other intervertebral disc degeneration, lumbar region: Secondary | ICD-10-CM | POA: Insufficient documentation

## 2016-09-20 DIAGNOSIS — M549 Dorsalgia, unspecified: Secondary | ICD-10-CM | POA: Insufficient documentation

## 2016-09-20 DIAGNOSIS — I700 Atherosclerosis of aorta: Secondary | ICD-10-CM | POA: Diagnosis not present

## 2016-10-19 ENCOUNTER — Other Ambulatory Visit (HOSPITAL_COMMUNITY): Payer: Self-pay | Admitting: Family Medicine

## 2016-10-19 DIAGNOSIS — M51369 Other intervertebral disc degeneration, lumbar region without mention of lumbar back pain or lower extremity pain: Secondary | ICD-10-CM

## 2016-10-19 DIAGNOSIS — M5136 Other intervertebral disc degeneration, lumbar region: Secondary | ICD-10-CM

## 2016-10-28 ENCOUNTER — Ambulatory Visit (HOSPITAL_COMMUNITY)
Admission: RE | Admit: 2016-10-28 | Discharge: 2016-10-28 | Disposition: A | Discharge status: Home / Self Care | Payer: Medicare HMO | Source: Ambulatory Visit | Attending: Family Medicine | Admitting: Family Medicine

## 2016-10-28 DIAGNOSIS — M5136 Other intervertebral disc degeneration, lumbar region: Secondary | ICD-10-CM

## 2016-10-28 DIAGNOSIS — M48061 Spinal stenosis, lumbar region without neurogenic claudication: Secondary | ICD-10-CM | POA: Insufficient documentation

## 2016-10-28 DIAGNOSIS — M419 Scoliosis, unspecified: Secondary | ICD-10-CM | POA: Diagnosis not present

## 2017-02-04 ENCOUNTER — Other Ambulatory Visit (HOSPITAL_COMMUNITY): Payer: Self-pay | Admitting: Family Medicine

## 2017-02-04 ENCOUNTER — Ambulatory Visit (HOSPITAL_COMMUNITY)
Admission: RE | Admit: 2017-02-04 | Discharge: 2017-02-04 | Disposition: A | Discharge status: Home / Self Care | Payer: Medicare HMO | Source: Ambulatory Visit | Attending: Family Medicine | Admitting: Family Medicine

## 2017-02-04 DIAGNOSIS — R200 Anesthesia of skin: Secondary | ICD-10-CM

## 2017-02-04 DIAGNOSIS — Z981 Arthrodesis status: Secondary | ICD-10-CM | POA: Diagnosis not present

## 2017-02-04 DIAGNOSIS — M5432 Sciatica, left side: Principal | ICD-10-CM

## 2017-02-04 DIAGNOSIS — I6529 Occlusion and stenosis of unspecified carotid artery: Secondary | ICD-10-CM | POA: Diagnosis not present

## 2017-02-04 DIAGNOSIS — M50321 Other cervical disc degeneration at C4-C5 level: Secondary | ICD-10-CM | POA: Diagnosis not present

## 2017-02-04 DIAGNOSIS — M5431 Sciatica, right side: Secondary | ICD-10-CM

## 2017-02-08 ENCOUNTER — Other Ambulatory Visit (HOSPITAL_COMMUNITY): Payer: Self-pay | Admitting: Family Medicine

## 2017-02-08 DIAGNOSIS — I6523 Occlusion and stenosis of bilateral carotid arteries: Secondary | ICD-10-CM

## 2017-02-14 ENCOUNTER — Encounter (HOSPITAL_COMMUNITY): Payer: Self-pay

## 2017-02-14 ENCOUNTER — Ambulatory Visit (HOSPITAL_COMMUNITY)
Admission: RE | Admit: 2017-02-14 | Discharge: 2017-02-14 | Disposition: A | Discharge status: Home / Self Care | Payer: Medicare HMO | Source: Ambulatory Visit | Attending: Family Medicine | Admitting: Family Medicine

## 2017-02-21 ENCOUNTER — Ambulatory Visit (HOSPITAL_COMMUNITY)
Admission: RE | Admit: 2017-02-21 | Discharge: 2017-02-21 | Disposition: A | Discharge status: Home / Self Care | Payer: Medicare HMO | Source: Ambulatory Visit | Attending: Family Medicine | Admitting: Family Medicine

## 2017-02-21 DIAGNOSIS — I6523 Occlusion and stenosis of bilateral carotid arteries: Secondary | ICD-10-CM | POA: Insufficient documentation

## 2017-03-14 ENCOUNTER — Other Ambulatory Visit: Payer: Self-pay | Admitting: Family Medicine

## 2017-03-14 DIAGNOSIS — M545 Low back pain: Principal | ICD-10-CM

## 2017-03-14 DIAGNOSIS — G8929 Other chronic pain: Secondary | ICD-10-CM

## 2017-03-22 ENCOUNTER — Ambulatory Visit
Admission: RE | Admit: 2017-03-22 | Discharge: 2017-03-22 | Disposition: A | Discharge status: Home / Self Care | Payer: Medicare HMO | Source: Ambulatory Visit | Attending: Family Medicine | Admitting: Family Medicine

## 2017-03-22 DIAGNOSIS — M545 Low back pain: Principal | ICD-10-CM

## 2017-03-22 DIAGNOSIS — G8929 Other chronic pain: Secondary | ICD-10-CM

## 2017-03-22 MED ORDER — METHYLPREDNISOLONE ACETATE 40 MG/ML INJ SUSP (RADIOLOG
120.0000 mg | Freq: Once | INTRAMUSCULAR | Status: AC
  Administered 2017-03-22: 120 mg via EPIDURAL

## 2017-03-22 MED ORDER — IOPAMIDOL (ISOVUE-M 200) INJECTION 41%
1.0000 mL | Freq: Once | INTRAMUSCULAR | Status: AC
  Administered 2017-03-22: 1 mL via EPIDURAL

## 2017-03-22 NOTE — Discharge Instructions (Signed)

## 2017-03-25 ENCOUNTER — Other Ambulatory Visit: Payer: Self-pay

## 2017-03-25 ENCOUNTER — Emergency Department (HOSPITAL_COMMUNITY): Payer: Medicare HMO

## 2017-03-25 ENCOUNTER — Encounter (HOSPITAL_COMMUNITY): Payer: Self-pay | Admitting: Emergency Medicine

## 2017-03-25 ENCOUNTER — Emergency Department (HOSPITAL_COMMUNITY)
Admission: EM | Admit: 2017-03-25 | Discharge: 2017-03-25 | Disposition: A | Discharge status: Home / Self Care | Payer: Medicare HMO | Attending: Emergency Medicine | Admitting: Emergency Medicine

## 2017-03-25 DIAGNOSIS — R42 Dizziness and giddiness: Secondary | ICD-10-CM | POA: Insufficient documentation

## 2017-03-25 DIAGNOSIS — M5440 Lumbago with sciatica, unspecified side: Secondary | ICD-10-CM | POA: Insufficient documentation

## 2017-03-25 DIAGNOSIS — R51 Headache: Secondary | ICD-10-CM | POA: Diagnosis not present

## 2017-03-25 DIAGNOSIS — F17210 Nicotine dependence, cigarettes, uncomplicated: Secondary | ICD-10-CM | POA: Insufficient documentation

## 2017-03-25 DIAGNOSIS — Z7982 Long term (current) use of aspirin: Secondary | ICD-10-CM | POA: Insufficient documentation

## 2017-03-25 DIAGNOSIS — Z79899 Other long term (current) drug therapy: Secondary | ICD-10-CM | POA: Insufficient documentation

## 2017-03-25 DIAGNOSIS — G8929 Other chronic pain: Secondary | ICD-10-CM

## 2017-03-25 DIAGNOSIS — M549 Dorsalgia, unspecified: Secondary | ICD-10-CM | POA: Diagnosis present

## 2017-03-25 HISTORY — DX: Dorsalgia, unspecified: M54.9

## 2017-03-25 LAB — COMPREHENSIVE METABOLIC PANEL
ALK PHOS: 89 U/L (ref 38–126)
ALT: 22 U/L (ref 17–63)
AST: 25 U/L (ref 15–41)
Albumin: 4 g/dL (ref 3.5–5.0)
Anion gap: 12 (ref 5–15)
BUN: 12 mg/dL (ref 6–20)
CALCIUM: 9.3 mg/dL (ref 8.9–10.3)
CHLORIDE: 98 mmol/L — AB (ref 101–111)
CO2: 26 mmol/L (ref 22–32)
CREATININE: 0.8 mg/dL (ref 0.61–1.24)
GFR calc Af Amer: >60 mL/min (ref >60)
GFR calc non Af Amer: >60 mL/min (ref >60)
Glucose, Bld: 96 mg/dL (ref 65–99)
Potassium: 3.2 mmol/L — ABNORMAL LOW (ref 3.5–5.1)
SODIUM: 136 mmol/L (ref 135–145)
Total Bilirubin: 0.3 mg/dL (ref 0.3–1.2)
Total Protein: 7.2 g/dL (ref 6.5–8.1)

## 2017-03-25 LAB — DIFFERENTIAL
BASOS ABS: 0 10*3/uL (ref 0.0–0.1)
BASOS PCT: 0 %
Eosinophils Absolute: 0 10*3/uL (ref 0.0–0.7)
Eosinophils Relative: 0 %
LYMPHS PCT: 17 %
Lymphs Abs: 1.7 10*3/uL (ref 0.7–4.0)
MONO ABS: 0.8 10*3/uL (ref 0.1–1.0)
MONOS PCT: 8 %
Neutro Abs: 7.2 10*3/uL (ref 1.7–7.7)
Neutrophils Relative %: 75 %

## 2017-03-25 LAB — URINALYSIS, ROUTINE W REFLEX MICROSCOPIC
Bilirubin Urine: NEGATIVE
GLUCOSE, UA: NEGATIVE mg/dL
HGB URINE DIPSTICK: NEGATIVE
Ketones, ur: NEGATIVE mg/dL
Leukocytes, UA: NEGATIVE
Nitrite: NEGATIVE
PH: 6 (ref 5.0–8.0)
Protein, ur: NEGATIVE mg/dL
SPECIFIC GRAVITY, URINE: 1.013 (ref 1.005–1.030)

## 2017-03-25 LAB — CBC
HEMATOCRIT: 46.5 % (ref 39.0–52.0)
Hemoglobin: 15.2 g/dL (ref 13.0–17.0)
MCH: 28.9 pg (ref 26.0–34.0)
MCHC: 32.7 g/dL (ref 30.0–36.0)
MCV: 88.4 fL (ref 78.0–100.0)
PLATELETS: 229 10*3/uL (ref 150–400)
RBC: 5.26 MIL/uL (ref 4.22–5.81)
RDW: 12.8 % (ref 11.5–15.5)
WBC: 9.7 10*3/uL (ref 4.0–10.5)

## 2017-03-25 LAB — I-STAT CHEM 8, ED
BUN: 12 mg/dL (ref 6–20)
CALCIUM ION: 1.17 mmol/L (ref 1.15–1.40)
CHLORIDE: 98 mmol/L — AB (ref 101–111)
CREATININE: 0.7 mg/dL (ref 0.61–1.24)
Glucose, Bld: 95 mg/dL (ref 65–99)
HCT: 44 % (ref 39.0–52.0)
Hemoglobin: 15 g/dL (ref 13.0–17.0)
POTASSIUM: 3.3 mmol/L — AB (ref 3.5–5.1)
Sodium: 139 mmol/L (ref 135–145)
TCO2: 29 mmol/L (ref 22–32)

## 2017-03-25 LAB — I-STAT TROPONIN, ED: Troponin i, poc: 0 ng/mL (ref 0.00–0.08)

## 2017-03-25 LAB — APTT: APTT: 29 s (ref 24–36)

## 2017-03-25 LAB — PROTIME-INR
INR: 1.03
Prothrombin Time: 13.4 seconds (ref 11.4–15.2)

## 2017-03-25 MED ORDER — HYDROMORPHONE HCL 1 MG/ML IJ SOLN
1.0000 mg | Freq: Once | INTRAMUSCULAR | Status: AC
  Administered 2017-03-25: 1 mg via INTRAVENOUS
  Filled 2017-03-25: qty 1

## 2017-03-25 MED ORDER — GADOBENATE DIMEGLUMINE 529 MG/ML IV SOLN
17.0000 mL | Freq: Once | INTRAVENOUS | Status: AC | PRN
  Administered 2017-03-25: 17 mL via INTRAVENOUS

## 2017-03-25 MED ORDER — OXYCODONE-ACETAMINOPHEN 5-325 MG PO TABS: 2.0000 | ORAL_TABLET | Freq: Once | ORAL | Status: DC

## 2017-03-25 MED ORDER — OXYCODONE-ACETAMINOPHEN 5-325 MG PO TABS: 1.0000 | ORAL_TABLET | Freq: Three times a day (TID) | ORAL | 0 refills | Status: AC | PRN

## 2017-03-25 NOTE — ED Provider Notes (Signed)
[facility] EMERGENCY DEPARTMENT Provider Note   CSN: [id number] Arrival date & time: 03/25/17  1335     History   Chief Complaint Chief Complaint  Patient presents with  . Fall    HPI Craig Green is a 77 y.o. male.  HPI Patient presents with back and right leg pain.  States he has had falls.  States he does not black out.  States he is unsteady because of the pain in his right leg.  Had a recent epidural injection 3 days ago.  States pain is continued.  Has some weakness on the right leg.  States it is due to pain.  Also difficulty using his right arm.  States this is been going chronically and is been worse over the last few months.  Does have some headaches.  Walks with a cane.  No relief of his pain medicines at home.  No chest pain.  No trouble breathing. Past Medical History:  Diagnosis Date  . Anxiety   . Arthritis   . Back pain   . Depression   . Full dentures   . GERD (gastroesophageal reflux disease)   . Heavy smoker (more than 20 cigarettes per day)   . Hyperlipemia     There are no active problems to display for this patient.   Past Surgical History:  Procedure Laterality Date  . [place] CATHETERIZATION  2001   normal  . CERVICAL FUSION  2001  . FINGER ARTHRODESIS Right 04/05/2012   Procedure: RIGHT THUMB METACARPOPHALANGEAL ARTHRODESIS ;  Surgeon: Jolyn Nap, MD;  Location: [place];  Service: Orthopedics;  Laterality: Right;  . LESION REMOVAL N/A 04/12/2013   Procedure: EXCISION OF ABDOMINAL WART;  Surgeon: Marissa Nestle, MD;  Location: [facility];  Service: Urology;  Laterality: N/A;  . ORIF ANKLE FRACTURE Right   . PENILE BIOPSY N/A 04/12/2013   Procedure: PENILE BIOPSY AND FULGURATION;  Surgeon: Marissa Nestle, MD;  Location: [facility];  Service: Urology;  Laterality: N/A;       Home Medications    Prior to Admission medications   Medication Sig Start Date End Date Taking? Authorizing  Provider  ALPRAZolam Duanne Moron) 1 MG tablet Take 1 mg by mouth QID.   Yes [provider]  aspirin 81 MG tablet Take 81 mg by mouth daily.   Yes [provider]  atorvastatin (LIPITOR) 40 MG tablet Take 40 mg by mouth daily.   Yes [provider]  FLUoxetine (PROZAC) 20 MG capsule Take 20 mg by mouth daily.   Yes [provider]  HYDROcodone-acetaminophen (NORCO) 10-325 MG per tablet Take 1 tablet by mouth every 6 (six) hours as needed.   Yes [provider]  omeprazole (PRILOSEC) 20 MG capsule Take 20 mg by mouth daily.   Yes [provider]    Family History History reviewed. No pertinent family history.  Social History Social History   Tobacco Use  . Smoking status: Current Every Day Smoker    Packs/day: 2.00    Years: 60.00    Pack years: 120.00  . Smokeless tobacco: Never Used  Substance Use Topics  . Alcohol use: Yes    Comment: used to. been 6 years  . Drug use: No     Allergies   Patient has no known allergies.   Review of Systems Review of Systems  Constitutional: Negative for appetite change and fever.  HENT: Negative  for congestion.   Respiratory: Negative for shortness of breath.   Cardiovascular: Negative for leg swelling.  Gastrointestinal: Negative for abdominal pain.  Genitourinary: Negative for flank pain.  Musculoskeletal: Positive for back pain.  Skin: Negative for rash.  Neurological: Positive for dizziness, weakness and headaches.     Physical Exam Updated Vital Signs BP (!) 197/105 (BP Location: Right Arm)   Pulse 75   Temp 97.6 F (36.4 C) (Oral)   Resp 16   Ht 5\' 9"  (1.753 m)   Wt 86.2 kg (190 lb)   SpO2 96%   BMI 28.06 kg/m   Physical Exam  Constitutional: He is oriented to person, place, and time. He appears well-developed.  HENT:  Head: Atraumatic.  Eyes: Pupils are equal, round, and reactive to light.  Neck: Neck supple.  Cardiovascular: Normal rate.  Pulmonary/Chest:  Effort normal.  Abdominal: Soft.  Musculoskeletal:  Some mild lumbar tenderness.  Some pain with straight leg raise on the right.  Neurological: He is alert and oriented to person, place, and time.  External ocular movements intact.  Face symmetric.  May have slightly decreased grip strength on right compared to left.  Finger-nose intact bilaterally.  Good plantar flexion of both feet.  Increased dorsiflexion of toes on left side.  Patient states this is because he broke his toes.  Able to actively raise bilateral straight legs.  Able to stand and mildly pain limited gait.  Skin: Skin is warm. Capillary refill takes less than 2 seconds.     ED Treatments / Results  Labs (all labs ordered are listed, but only abnormal results are displayed) Labs Reviewed  COMPREHENSIVE METABOLIC PANEL - Abnormal; Notable for the following components:      Result Value   Potassium 3.2 (*)    Chloride 98 (*)    All other components within normal limits  I-STAT CHEM 8, ED - Abnormal; Notable for the following components:   Potassium 3.3 (*)    Chloride 98 (*)    All other components within normal limits  PROTIME-INR  APTT  CBC  DIFFERENTIAL  URINALYSIS, ROUTINE W REFLEX MICROSCOPIC  I-STAT TROPONIN, ED    EKG  EKG Interpretation  Date/Time:  Friday March 25 2017 14:11:31 EST Ventricular Rate:  59 PR Interval:    QRS Duration: 96 QT Interval:  403 QTC Calculation: 400 R Axis:   30 Text Interpretation:  Sinus rhythm no obvious changes from previous Confirmed by Merrily Pew [phone]) on 03/25/2017 3:38:27 PM       Radiology Ct Head Wo Contrast  Result Date: 03/25/2017 CLINICAL DATA:  Fall.  Dizziness and confusion.  Headache. EXAM: CT HEAD WITHOUT CONTRAST TECHNIQUE: Contiguous axial images were obtained from the base of the skull through the vertex without intravenous contrast. COMPARISON:  None. FINDINGS: Brain: No mass lesion, intraparenchymal hemorrhage or extra-axial collection. No  evidence of acute cortical infarct. There is periventricular hypoattenuation compatible with chronic microvascular disease. Vascular: No hyperdense vessel or unexpected calcification. Skull: Normal visualized skull base, calvarium and extracranial soft tissues. Sinuses/Orbits: No sinus fluid levels or advanced mucosal thickening. No mastoid effusion. Normal orbits. IMPRESSION: Chronic ischemic microangiopathy without acute intracranial abnormality. Electronically Signed   By: Ulyses Jarred M.D.   On: 03/25/2017 14:12    Procedures Procedures (including critical care time)  Medications Ordered in ED Medications  HYDROmorphone (DILAUDID) injection 1 mg (1 mg Intravenous Given 03/25/17 1453)     Initial Impression / Assessment and Plan / ED Course  I  have reviewed the triage vital signs and the nursing notes.  Pertinent labs & imaging results that were available during my care of the patient were reviewed by me and considered in my medical decision making (see chart for details).     Patient with right leg pain and weakness.  Recent epidural injection.  Will get MRI to evaluate for abscess or other pathology.  Also questionable right upper extremity weakness.  States this is been going longer than the injection however.  No real neck pain.  At times will get headaches.  States he is chronically weak in that hand.  Will also get MRI to evaluate brain.  Patient may just need some pain control.  Care turned over to Dr. Dayna Barker.  Final Clinical Impressions(s) / ED Diagnoses   Final diagnoses:  Chronic right-sided low back pain with sciatica, sciatica laterality unspecified    ED Discharge Orders    None       Davonna Belling, MD 03/25/17 1549
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
## 2017-03-25 NOTE — ED Provider Notes (Signed)
3:41 PM Assumed care from Dr. Alvino Chapel, please see their note for full history, physical and decision making until this point. In brief this is a 77 y.o. year old male who presented to the ED tonight with Fall     77 year old male recently had a back injection for chronic pain and has had some worsening pain with falls and probably some weakness in the right lower extremity since that time.  Has chronic right arm weakness as well so difficult to ascertain if this is worse than normal.  Planning for MRI of brain to rule out stroke and MR of his spine to rule out abscess or other space-occupying lesions.  If these are normal patient can be discharged on pain medication.  MRI without acute changes but does show some canal stenosis that could explain symptoms. Stable for dc with temporary increase in pain medication and close follow up. Care management consulted to help with PT/OT at home in mean time.   Discharge instructions, including strict return precautions for new or worsening symptoms, given. Patient and/or family verbalized understanding and agreement with the plan as described.   Labs, studies and imaging reviewed by myself and considered in medical decision making if ordered. Imaging interpreted by radiology.  Labs Reviewed  COMPREHENSIVE METABOLIC PANEL - Abnormal; Notable for the following components:      Result Value   Potassium 3.2 (*)    Chloride 98 (*)    All other components within normal limits  I-STAT CHEM 8, ED - Abnormal; Notable for the following components:   Potassium 3.3 (*)    Chloride 98 (*)    All other components within normal limits  PROTIME-INR  APTT  CBC  DIFFERENTIAL  URINALYSIS, ROUTINE W REFLEX MICROSCOPIC  I-STAT TROPONIN, ED    CT HEAD WO CONTRAST  Final Result    MR Lumbar Spine W Wo Contrast    (Results Pending)  MR Brain Wo Contrast    (Results Pending)    No Follow-up on file.    Merrily Pew, MD 03/25/17 [phone]

## 2018-07-20 ENCOUNTER — Other Ambulatory Visit: Payer: Self-pay | Admitting: Family Medicine

## 2018-07-20 ENCOUNTER — Other Ambulatory Visit (HOSPITAL_COMMUNITY): Payer: Self-pay | Admitting: Family Medicine

## 2018-07-20 DIAGNOSIS — R634 Abnormal weight loss: Secondary | ICD-10-CM

## 2018-08-07 ENCOUNTER — Other Ambulatory Visit: Payer: Self-pay

## 2018-08-07 ENCOUNTER — Ambulatory Visit (HOSPITAL_COMMUNITY)
Admission: RE | Admit: 2018-08-07 | Discharge: 2018-08-07 | Disposition: A | Discharge status: Home / Self Care | Payer: Medicare HMO | Source: Ambulatory Visit | Attending: Family Medicine | Admitting: Family Medicine

## 2018-08-07 DIAGNOSIS — R634 Abnormal weight loss: Secondary | ICD-10-CM | POA: Diagnosis present

## 2018-08-07 LAB — POCT I-STAT CREATININE: Creatinine, Ser: 0.9 mg/dL (ref 0.61–1.24)

## 2018-08-07 MED ORDER — IOHEXOL 300 MG/ML  SOLN
100.0000 mL | Freq: Once | INTRAMUSCULAR | Status: AC | PRN
  Administered 2018-08-07: 08:00:00 100 mL via INTRAVENOUS

## 2018-08-11 IMAGING — US US CAROTID DUPLEX BILAT
1 series · 13 of 24 positions shown · non-contrast
Comparison: None.

CLINICAL DATA: Carotid atherosclerosis.

EXAM:
BILATERAL CAROTID DUPLEX ULTRASOUND
TECHNIQUE: Gray scale imaging, color Doppler and duplex ultrasound were
performed of bilateral carotid and vertebral arteries in the neck.

[Series 1: us carotid duplex bilat · 0.06mm/px · 13 of 81 slices shown]
[im 1/81]
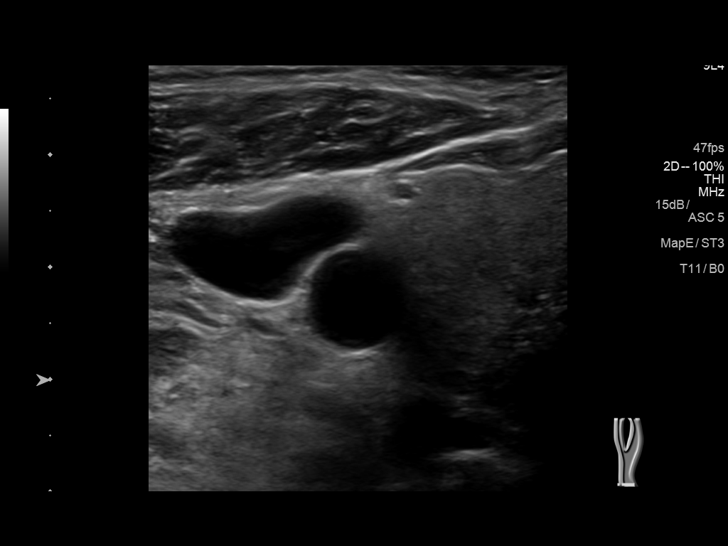
[im 7/81]
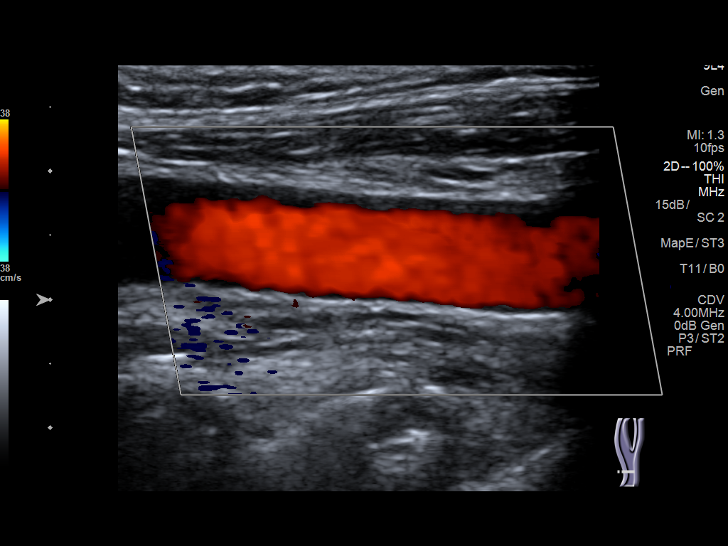
[im 14/81]
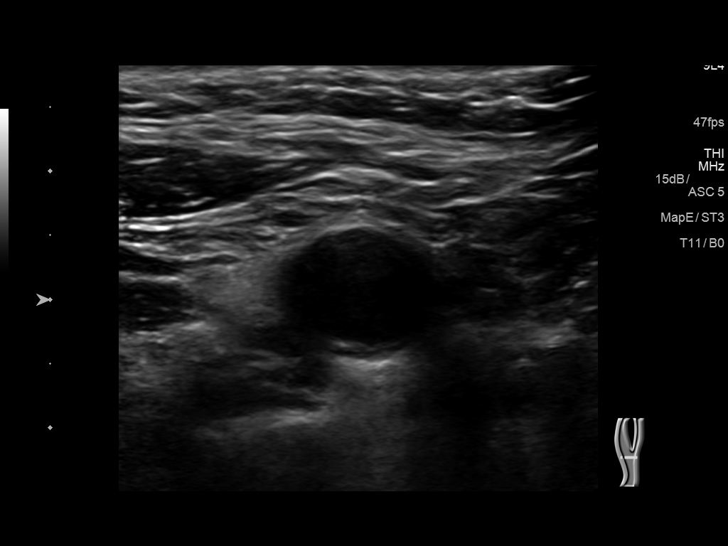
[im 21/81]
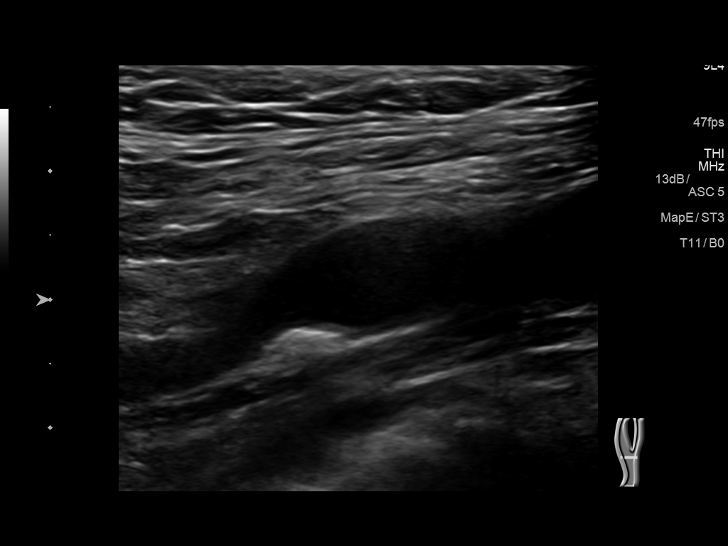
[im 28/81]
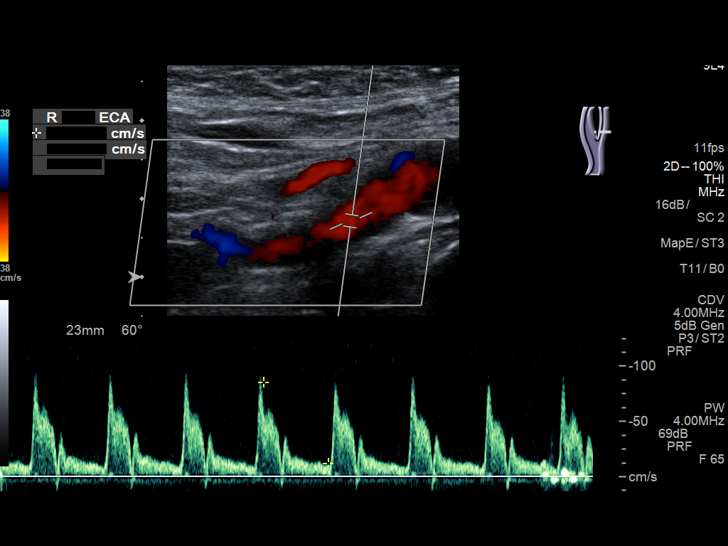
[im 35/81]
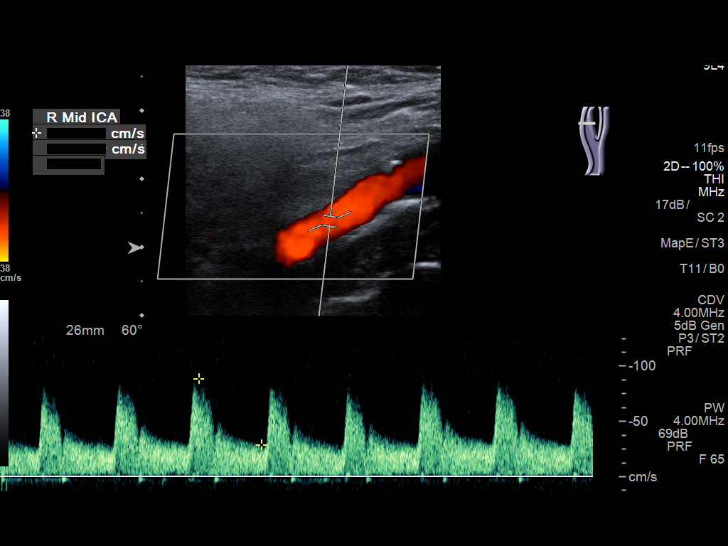
[im 42/81]
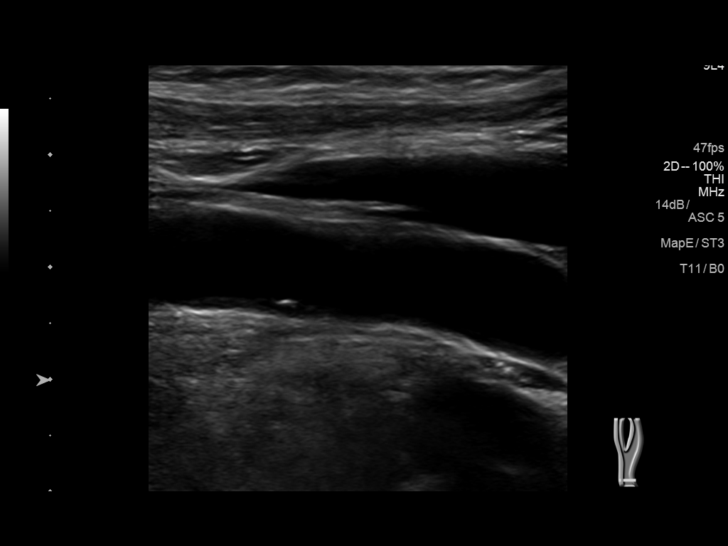
[im 46/81]
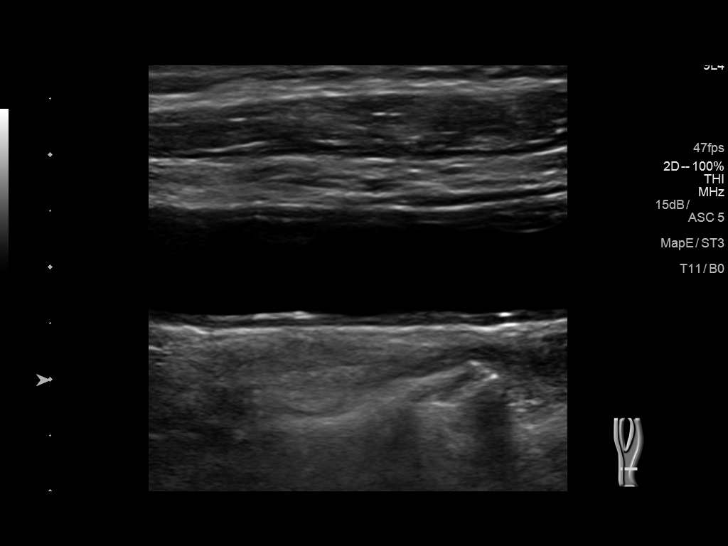
[im 53/81]
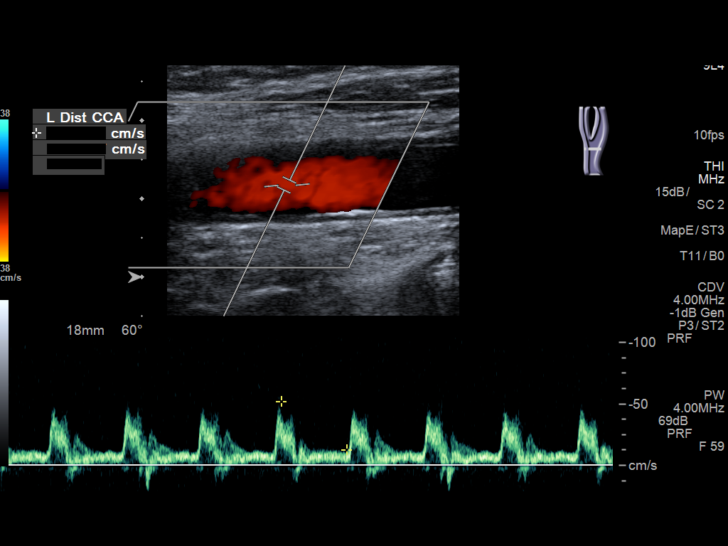
[im 60/81]
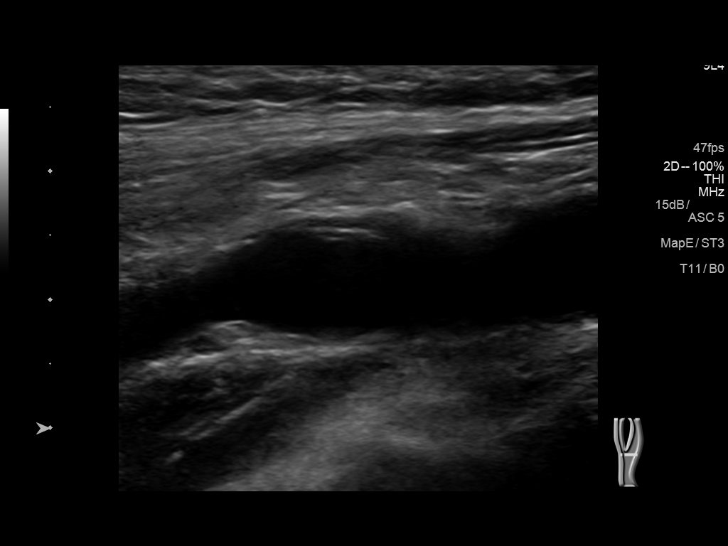
[im 67/81]
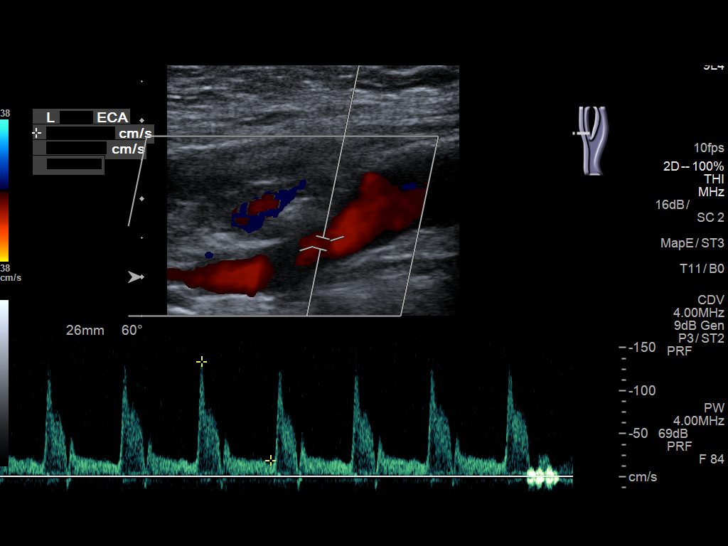
[im 74/81]
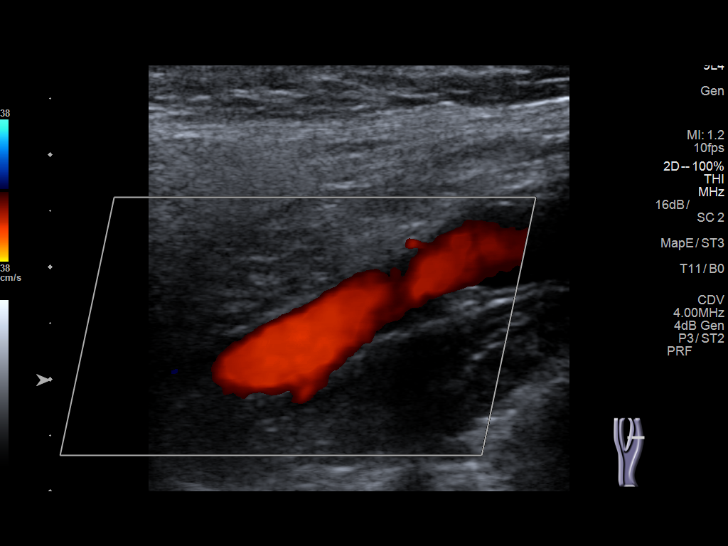
[im 81/81]
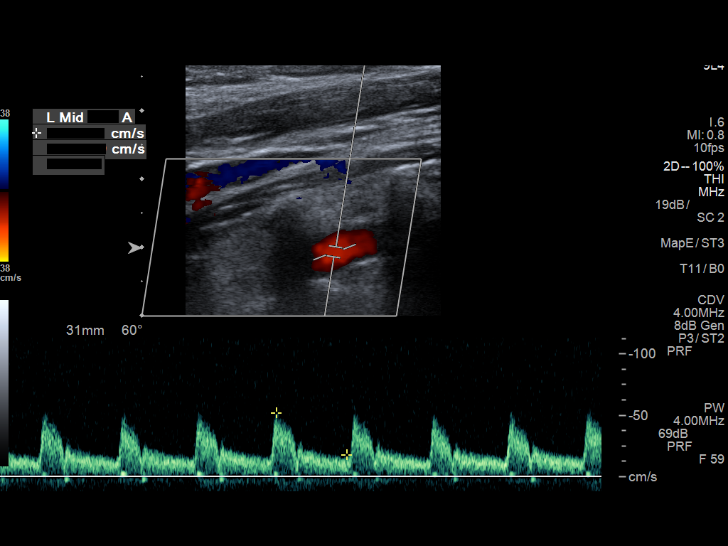

[13 of 24 positions shown; findings below may reference images not displayed]

FINDINGS: Criteria: Quantification of carotid stenosis is based on velocity
parameters that correlate the residual internal carotid diameter
with NASCET-based stenosis levels, using the diameter of the distal
internal carotid lumen as the denominator for stenosis measurement.

The following velocity measurements were obtained:

RIGHT

ICA:  89/29 cm/sec

CCA:  80/16 cm/sec

SYSTOLIC ICA/CCA RATIO:

DIASTOLIC ICA/CCA RATIO:

ECA:  86 cm/sec

LEFT

ICA:  85/30 cm/sec

CCA:  69/15 cm/sec

SYSTOLIC ICA/CCA RATIO:

DIASTOLIC ICA/CCA RATIO:

ECA:  134 cm/sec

RIGHT CAROTID ARTERY: There is a mild amount of partially calcified
plaque at the level of the right carotid bulb and a mild amount of
calcified plaque in the proximal right ICA. Velocities and waveforms
are normal and estimated right ICA stenosis is less than 50%.

RIGHT VERTEBRAL ARTERY: Antegrade flow with normal waveform and
velocity.

LEFT CAROTID ARTERY: There is a mild amount of partially calcified
plaque at the level of the carotid bulb and proximal ICA. Velocities
and waveforms are normal. Estimated left ICA stenosis is less than
50%.

LEFT VERTEBRAL ARTERY: Antegrade flow with normal waveform and
velocity.
IMPRESSION: Mild amount of plaque at the level of both carotid bulbs and
proximal internal carotid arteries. Estimated bilateral ICA stenoses
are less than 50%.

## 2018-08-18 ENCOUNTER — Other Ambulatory Visit (HOSPITAL_COMMUNITY): Payer: Self-pay | Admitting: Family Medicine

## 2018-08-18 ENCOUNTER — Other Ambulatory Visit: Payer: Self-pay | Admitting: Family Medicine

## 2018-08-18 DIAGNOSIS — K8020 Calculus of gallbladder without cholecystitis without obstruction: Secondary | ICD-10-CM

## 2018-08-21 ENCOUNTER — Other Ambulatory Visit (HOSPITAL_COMMUNITY): Payer: Self-pay | Admitting: Family Medicine

## 2018-08-21 DIAGNOSIS — K8020 Calculus of gallbladder without cholecystitis without obstruction: Secondary | ICD-10-CM

## 2018-08-21 DIAGNOSIS — K828 Other specified diseases of gallbladder: Secondary | ICD-10-CM

## 2018-08-24 ENCOUNTER — Other Ambulatory Visit: Payer: Self-pay

## 2018-08-24 ENCOUNTER — Ambulatory Visit (HOSPITAL_COMMUNITY)
Admission: RE | Admit: 2018-08-24 | Discharge: 2018-08-24 | Disposition: A | Discharge status: Home / Self Care | Payer: Medicare HMO | Source: Ambulatory Visit | Attending: Family Medicine | Admitting: Family Medicine

## 2018-08-24 DIAGNOSIS — K8020 Calculus of gallbladder without cholecystitis without obstruction: Secondary | ICD-10-CM | POA: Diagnosis present

## 2018-08-24 IMAGING — XA Imaging study
3 series · 3 of 3 positions shown · non-contrast
Comparison: none

CLINICAL DATA: Lumbosacral spondylosis without myelopathy. Low back
pain. Bilateral lower extremity pain and numbness. Advanced diffuse
lumbar disc degeneration with severe spinal stenosis at L3-4 and
L4-5.

[Series 1: ortho adipose · 1 of 1 slices shown (1 of 3)]
[im 1/1]
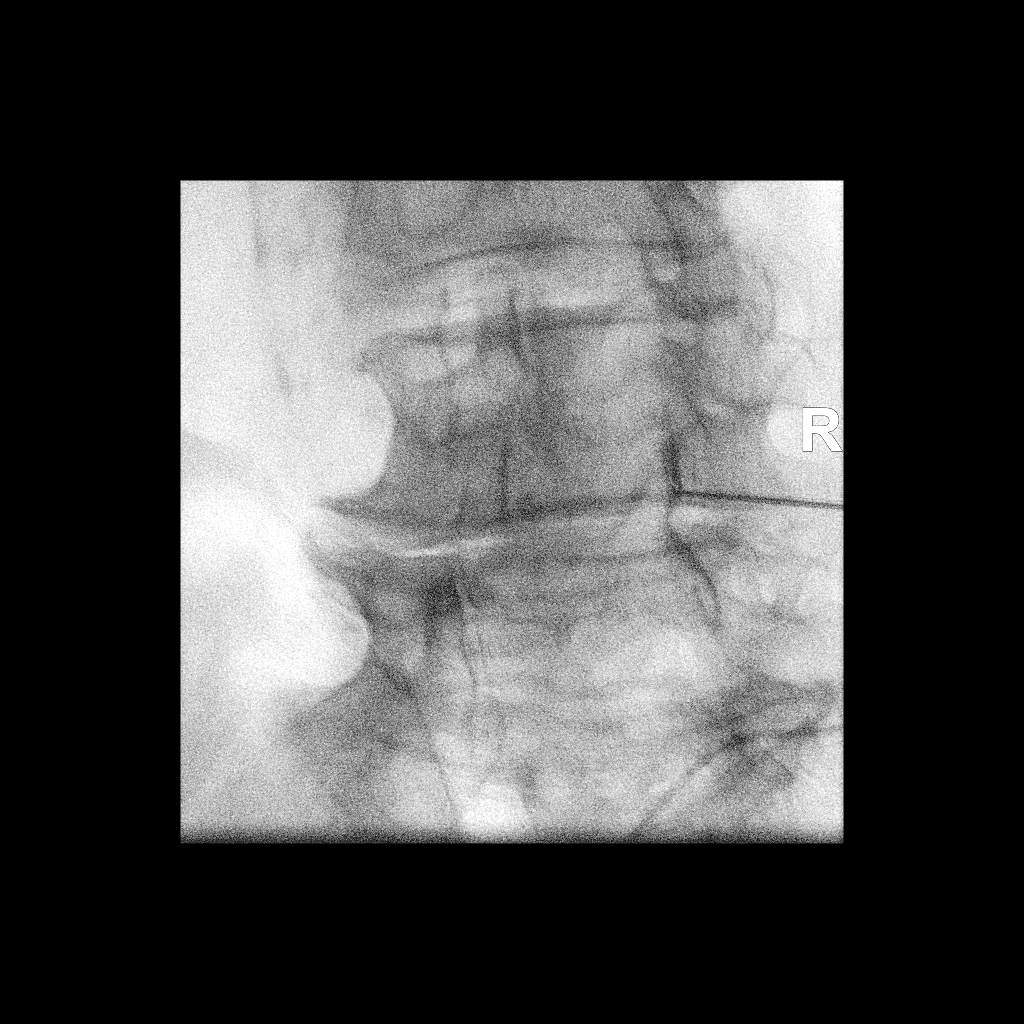

[Series 2: ortho adipose · 1 of 1 slices shown (2 of 3)]
[im 1/1]
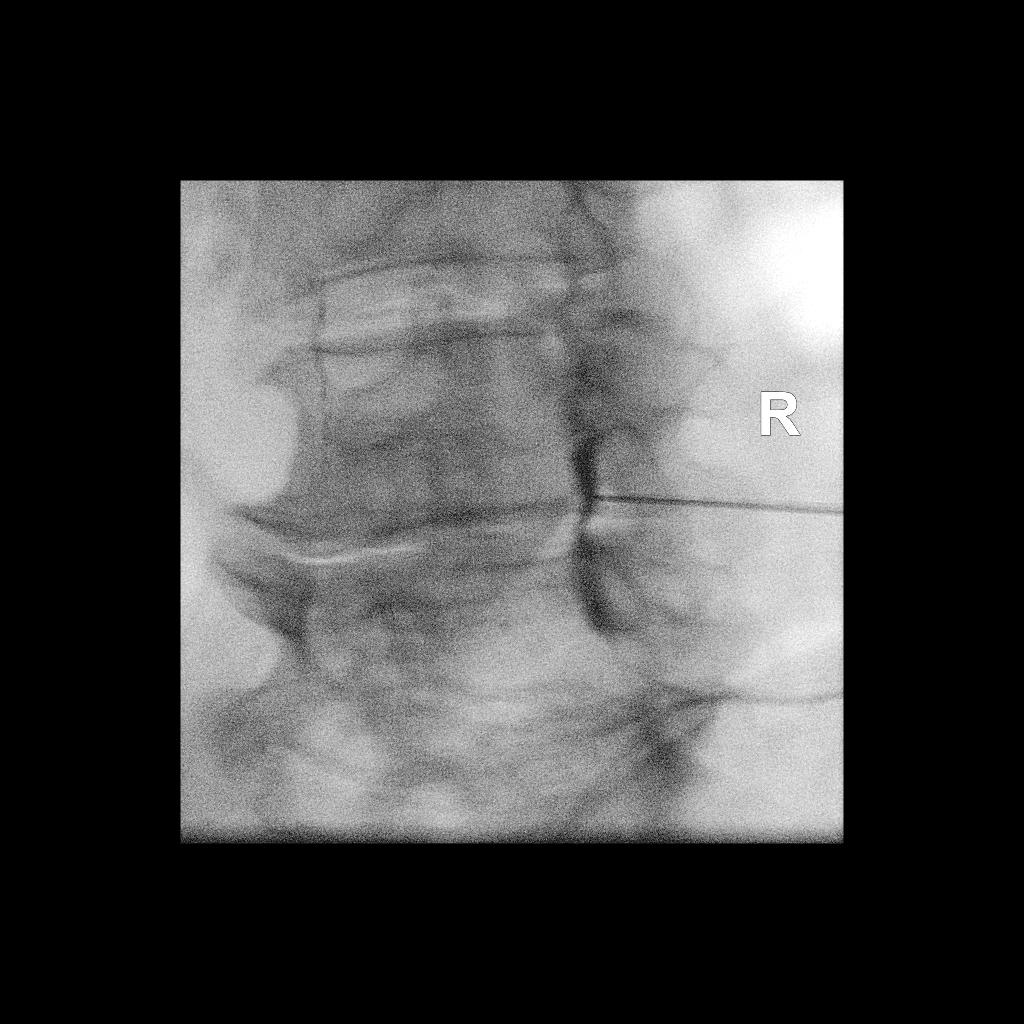

[Series 3: ortho adipose · 1 of 1 slices shown (3 of 3)]
[im 1/1]
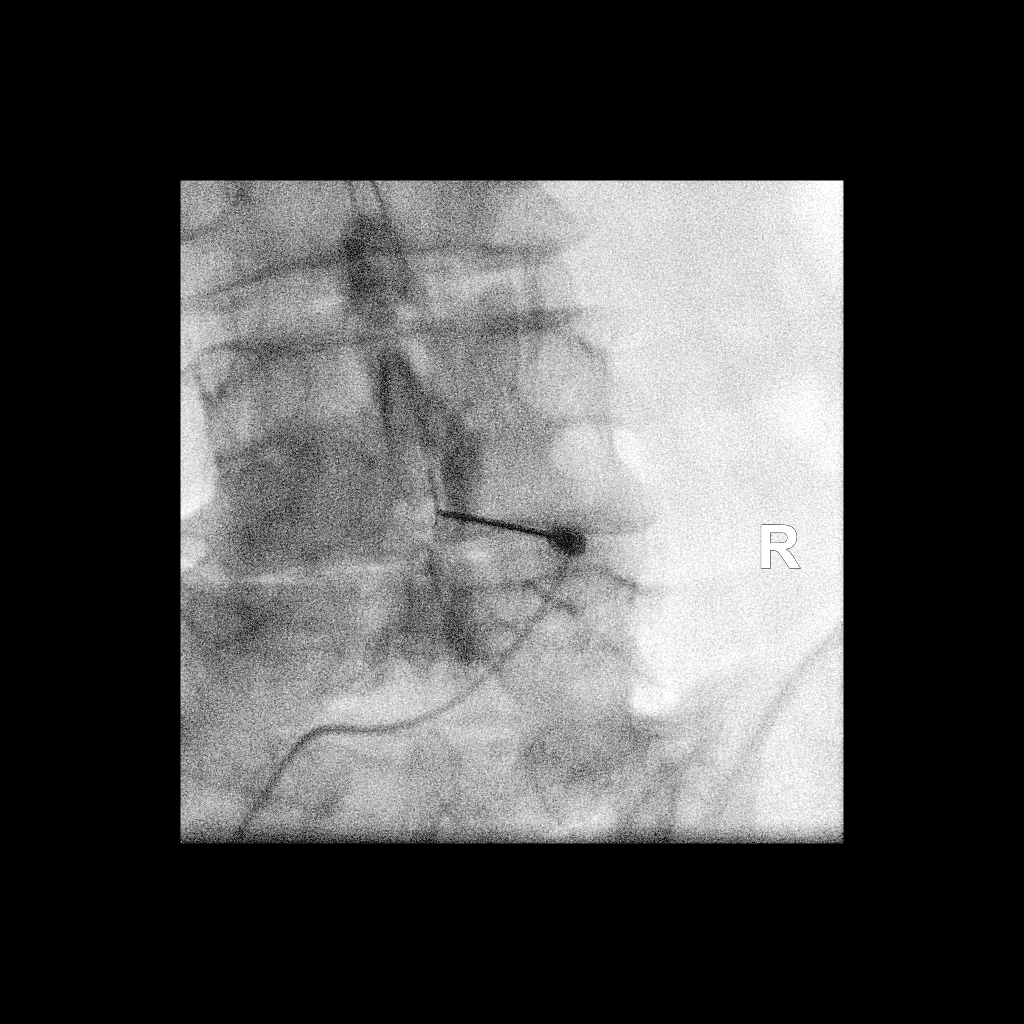

[3 of 3 positions shown; findings below may reference images not displayed]

FLUOROSCOPY TIME:  Radiation Exposure Index (as provided by the
fluoroscopic device): 32.14 microGray*m^2

Fluoroscopy Time (in minutes and seconds):  16 seconds

PROCEDURE:
The procedure, risks, benefits, and alternatives were explained to
the patient. Questions regarding the procedure were encouraged and
answered. The patient understands and consents to the procedure.

LUMBAR EPIDURAL INJECTION:

An interlaminar approach was performed on the right at L4-5. The
overlying skin was cleansed and anesthetized. A 3.5 inch 20 gauge
epidural needle was advanced using loss-of-resistance technique.

DIAGNOSTIC EPIDURAL INJECTION:

Injection of Isovue-M 200 shows a good epidural pattern with spread
above and below the level of needle placement, primarily on the
right. No vascular opacification is seen.

THERAPEUTIC EPIDURAL INJECTION:

120 mg of Depo-Medrol mixed with 3 mL of 1% lidocaine were
instilled. The procedure was well-tolerated, and the patient was
discharged thirty minutes following the injection in good condition.

COMPLICATIONS:
None
IMPRESSION: Technically successful lumbar interlaminar epidural injection on the
right at L4-5.

## 2018-08-25 ENCOUNTER — Encounter (HOSPITAL_COMMUNITY): Payer: Self-pay

## 2018-08-25 ENCOUNTER — Encounter (HOSPITAL_COMMUNITY)
Admission: RE | Admit: 2018-08-25 | Discharge: 2018-08-25 | Disposition: A | Discharge status: Home / Self Care | Payer: Medicare HMO | Source: Ambulatory Visit | Attending: Family Medicine | Admitting: Family Medicine

## 2018-08-25 DIAGNOSIS — K828 Other specified diseases of gallbladder: Secondary | ICD-10-CM | POA: Insufficient documentation

## 2018-08-25 DIAGNOSIS — K8020 Calculus of gallbladder without cholecystitis without obstruction: Secondary | ICD-10-CM | POA: Insufficient documentation

## 2018-08-25 HISTORY — DX: Malignant (primary) neoplasm, unspecified: C80.1

## 2018-08-25 MED ORDER — TECHNETIUM TC 99M MEBROFENIN IV KIT
5.0000 | PACK | Freq: Once | INTRAVENOUS | Status: AC | PRN
  Administered 2018-08-25: 5.3 via INTRAVENOUS

## 2019-09-29 ENCOUNTER — Ambulatory Visit
Admission: EM | Admit: 2019-09-29 | Discharge: 2019-09-29 | Disposition: A | Discharge status: Home / Self Care | Payer: Medicare HMO | Attending: Emergency Medicine | Admitting: Emergency Medicine

## 2019-09-29 ENCOUNTER — Encounter: Payer: Self-pay | Admitting: Emergency Medicine

## 2019-09-29 ENCOUNTER — Other Ambulatory Visit: Payer: Self-pay

## 2019-09-29 DIAGNOSIS — S0591XA Unspecified injury of right eye and orbit, initial encounter: Secondary | ICD-10-CM | POA: Diagnosis not present

## 2019-09-29 DIAGNOSIS — H5789 Other specified disorders of eye and adnexa: Secondary | ICD-10-CM | POA: Diagnosis not present

## 2019-09-29 MED ORDER — OFLOXACIN 0.3 % OP SOLN: 1.0000 [drp] | Freq: Four times a day (QID) | OPHTHALMIC | 0 refills | Status: AC

## 2019-09-29 NOTE — Discharge Instructions (Signed)
Use ofloxacin as prescribed and to completion Use OTC systane or genteal gel eye drops at night as needed for symptomatic relief Use OTC ibuprofen or tylenol as needed for pain relief Return here or follow up with ophthamolgy if symptoms persists or worsen such as fever, chills, redness, swelling, eye pain, painful eye movements, vision changes, etc..... 
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
## 2022-12-10 DIAGNOSIS — M5134 Other intervertebral disc degeneration, thoracic region: Secondary | ICD-10-CM | POA: Diagnosis not present

## 2022-12-10 DIAGNOSIS — F419 Anxiety disorder, unspecified: Secondary | ICD-10-CM | POA: Diagnosis not present

## 2022-12-10 DIAGNOSIS — Z6825 Body mass index (BMI) 25.0-25.9, adult: Secondary | ICD-10-CM | POA: Diagnosis not present

## 2022-12-10 DIAGNOSIS — M5030 Other cervical disc degeneration, unspecified cervical region: Secondary | ICD-10-CM | POA: Diagnosis not present

## 2023-02-03 DIAGNOSIS — Z6825 Body mass index (BMI) 25.0-25.9, adult: Secondary | ICD-10-CM | POA: Diagnosis not present

## 2023-02-03 DIAGNOSIS — E663 Overweight: Secondary | ICD-10-CM | POA: Diagnosis not present

## 2023-02-03 DIAGNOSIS — M5030 Other cervical disc degeneration, unspecified cervical region: Secondary | ICD-10-CM | POA: Diagnosis not present

## 2023-02-03 DIAGNOSIS — M5134 Other intervertebral disc degeneration, thoracic region: Secondary | ICD-10-CM | POA: Diagnosis not present

## 2023-02-09 DIAGNOSIS — Z08 Encounter for follow-up examination after completed treatment for malignant neoplasm: Secondary | ICD-10-CM | POA: Diagnosis not present

## 2023-02-09 DIAGNOSIS — C44319 Basal cell carcinoma of skin of other parts of face: Secondary | ICD-10-CM | POA: Diagnosis not present

## 2023-02-09 DIAGNOSIS — C44321 Squamous cell carcinoma of skin of nose: Secondary | ICD-10-CM | POA: Diagnosis not present

## 2023-02-09 DIAGNOSIS — Z85828 Personal history of other malignant neoplasm of skin: Secondary | ICD-10-CM | POA: Diagnosis not present

## 2023-02-09 DIAGNOSIS — X32XXXD Exposure to sunlight, subsequent encounter: Secondary | ICD-10-CM | POA: Diagnosis not present

## 2023-02-09 DIAGNOSIS — L570 Actinic keratosis: Secondary | ICD-10-CM | POA: Diagnosis not present

## 2023-03-04 DIAGNOSIS — C44321 Squamous cell carcinoma of skin of nose: Secondary | ICD-10-CM | POA: Diagnosis not present

## 2023-03-10 DIAGNOSIS — E559 Vitamin D deficiency, unspecified: Secondary | ICD-10-CM | POA: Diagnosis not present

## 2023-03-10 DIAGNOSIS — Z6826 Body mass index (BMI) 26.0-26.9, adult: Secondary | ICD-10-CM | POA: Diagnosis not present

## 2023-03-10 DIAGNOSIS — M5030 Other cervical disc degeneration, unspecified cervical region: Secondary | ICD-10-CM | POA: Diagnosis not present

## 2023-03-10 DIAGNOSIS — E538 Deficiency of other specified B group vitamins: Secondary | ICD-10-CM | POA: Diagnosis not present

## 2023-03-10 DIAGNOSIS — F419 Anxiety disorder, unspecified: Secondary | ICD-10-CM | POA: Diagnosis not present

## 2023-03-10 DIAGNOSIS — M5134 Other intervertebral disc degeneration, thoracic region: Secondary | ICD-10-CM | POA: Diagnosis not present

## 2023-03-10 DIAGNOSIS — E663 Overweight: Secondary | ICD-10-CM | POA: Diagnosis not present

## 2023-03-10 DIAGNOSIS — R5383 Other fatigue: Secondary | ICD-10-CM | POA: Diagnosis not present

## 2023-03-30 DIAGNOSIS — D074 Carcinoma in situ of penis: Secondary | ICD-10-CM | POA: Diagnosis not present

## 2023-03-30 DIAGNOSIS — Z85828 Personal history of other malignant neoplasm of skin: Secondary | ICD-10-CM | POA: Diagnosis not present

## 2023-03-30 DIAGNOSIS — Z08 Encounter for follow-up examination after completed treatment for malignant neoplasm: Secondary | ICD-10-CM | POA: Diagnosis not present

## 2023-03-30 DIAGNOSIS — X32XXXD Exposure to sunlight, subsequent encounter: Secondary | ICD-10-CM | POA: Diagnosis not present

## 2023-03-30 DIAGNOSIS — L570 Actinic keratosis: Secondary | ICD-10-CM | POA: Diagnosis not present

## 2023-03-30 DIAGNOSIS — C44329 Squamous cell carcinoma of skin of other parts of face: Secondary | ICD-10-CM | POA: Diagnosis not present

## 2023-03-30 DIAGNOSIS — C44229 Squamous cell carcinoma of skin of left ear and external auricular canal: Secondary | ICD-10-CM | POA: Diagnosis not present

## 2023-04-07 DIAGNOSIS — Z6825 Body mass index (BMI) 25.0-25.9, adult: Secondary | ICD-10-CM | POA: Diagnosis not present

## 2023-04-07 DIAGNOSIS — F419 Anxiety disorder, unspecified: Secondary | ICD-10-CM | POA: Diagnosis not present

## 2023-04-07 DIAGNOSIS — M5134 Other intervertebral disc degeneration, thoracic region: Secondary | ICD-10-CM | POA: Diagnosis not present

## 2023-04-07 DIAGNOSIS — M5030 Other cervical disc degeneration, unspecified cervical region: Secondary | ICD-10-CM | POA: Diagnosis not present

## 2023-04-08 DIAGNOSIS — C44321 Squamous cell carcinoma of skin of nose: Secondary | ICD-10-CM | POA: Diagnosis not present

## 2023-05-06 DIAGNOSIS — R252 Cramp and spasm: Secondary | ICD-10-CM | POA: Diagnosis not present

## 2023-05-06 DIAGNOSIS — M549 Dorsalgia, unspecified: Secondary | ICD-10-CM | POA: Diagnosis not present

## 2023-05-06 DIAGNOSIS — E663 Overweight: Secondary | ICD-10-CM | POA: Diagnosis not present

## 2023-05-06 DIAGNOSIS — F419 Anxiety disorder, unspecified: Secondary | ICD-10-CM | POA: Diagnosis not present

## 2023-05-06 DIAGNOSIS — Z6825 Body mass index (BMI) 25.0-25.9, adult: Secondary | ICD-10-CM | POA: Diagnosis not present

## 2023-05-06 DIAGNOSIS — M5030 Other cervical disc degeneration, unspecified cervical region: Secondary | ICD-10-CM | POA: Diagnosis not present

## 2023-06-03 DIAGNOSIS — R252 Cramp and spasm: Secondary | ICD-10-CM | POA: Diagnosis not present

## 2023-06-03 DIAGNOSIS — M549 Dorsalgia, unspecified: Secondary | ICD-10-CM | POA: Diagnosis not present

## 2023-06-03 DIAGNOSIS — Z6825 Body mass index (BMI) 25.0-25.9, adult: Secondary | ICD-10-CM | POA: Diagnosis not present

## 2023-06-03 DIAGNOSIS — E663 Overweight: Secondary | ICD-10-CM | POA: Diagnosis not present

## 2023-06-03 DIAGNOSIS — S81002A Unspecified open wound, left knee, initial encounter: Secondary | ICD-10-CM | POA: Diagnosis not present

## 2023-06-09 DIAGNOSIS — L03119 Cellulitis of unspecified part of limb: Secondary | ICD-10-CM | POA: Diagnosis not present

## 2023-06-09 DIAGNOSIS — E663 Overweight: Secondary | ICD-10-CM | POA: Diagnosis not present

## 2023-06-09 DIAGNOSIS — L089 Local infection of the skin and subcutaneous tissue, unspecified: Secondary | ICD-10-CM | POA: Diagnosis not present

## 2023-06-09 DIAGNOSIS — T148XXA Other injury of unspecified body region, initial encounter: Secondary | ICD-10-CM | POA: Diagnosis not present

## 2023-06-09 DIAGNOSIS — Z6825 Body mass index (BMI) 25.0-25.9, adult: Secondary | ICD-10-CM | POA: Diagnosis not present

## 2023-07-04 DIAGNOSIS — M5134 Other intervertebral disc degeneration, thoracic region: Secondary | ICD-10-CM | POA: Diagnosis not present

## 2023-07-04 DIAGNOSIS — Z125 Encounter for screening for malignant neoplasm of prostate: Secondary | ICD-10-CM | POA: Diagnosis not present

## 2023-07-04 DIAGNOSIS — Z1331 Encounter for screening for depression: Secondary | ICD-10-CM | POA: Diagnosis not present

## 2023-07-04 DIAGNOSIS — Z0001 Encounter for general adult medical examination with abnormal findings: Secondary | ICD-10-CM | POA: Diagnosis not present

## 2023-07-04 DIAGNOSIS — Z6825 Body mass index (BMI) 25.0-25.9, adult: Secondary | ICD-10-CM | POA: Diagnosis not present

## 2023-07-14 ENCOUNTER — Emergency Department (HOSPITAL_COMMUNITY)

## 2023-07-14 ENCOUNTER — Encounter (HOSPITAL_COMMUNITY): Payer: Self-pay | Admitting: Emergency Medicine

## 2023-07-14 ENCOUNTER — Emergency Department (HOSPITAL_COMMUNITY)
Admission: EM | Admit: 2023-07-14 | Discharge: 2023-07-14 | Disposition: A | Discharge status: Home / Self Care | Attending: Emergency Medicine | Admitting: Emergency Medicine

## 2023-07-14 ENCOUNTER — Other Ambulatory Visit: Payer: Self-pay

## 2023-07-14 DIAGNOSIS — M7021 Olecranon bursitis, right elbow: Secondary | ICD-10-CM | POA: Diagnosis not present

## 2023-07-14 DIAGNOSIS — W208XXA Other cause of strike by thrown, projected or falling object, initial encounter: Secondary | ICD-10-CM | POA: Diagnosis not present

## 2023-07-14 DIAGNOSIS — Z7982 Long term (current) use of aspirin: Secondary | ICD-10-CM | POA: Diagnosis not present

## 2023-07-14 DIAGNOSIS — S5001XA Contusion of right elbow, initial encounter: Secondary | ICD-10-CM

## 2023-07-14 DIAGNOSIS — M25421 Effusion, right elbow: Secondary | ICD-10-CM | POA: Diagnosis not present

## 2023-07-14 DIAGNOSIS — Y9389 Activity, other specified: Secondary | ICD-10-CM | POA: Insufficient documentation

## 2023-07-14 DIAGNOSIS — M25521 Pain in right elbow: Secondary | ICD-10-CM | POA: Diagnosis not present

## 2023-07-14 MED ORDER — DICLOFENAC SODIUM 1 % EX GEL: 2.0000 g | Freq: Four times a day (QID) | CUTANEOUS | 0 refills | Status: AC

## 2023-07-14 NOTE — ED Notes (Signed)
 ED Provider at bedside.

## 2023-07-14 NOTE — Discharge Instructions (Addendum)
 Please continue to wear the sling as needed for comfort.  Follow-up closely with orthopedics for any continued symptoms.  Return to emergency department immediately for any new or worsening symptoms.

## 2023-07-14 NOTE — ED Notes (Signed)
 Ice Pack applied to Pts injured RUE.

## 2023-07-14 NOTE — ED Triage Notes (Signed)
 Pain and swelling to RT upper arm/ elbow x 2days after large tree limb fell on that arm

## 2023-07-14 NOTE — ED Provider Notes (Signed)
 [facility] EMERGENCY DEPARTMENT AT [facility] Provider Note   CSN: [id number] Arrival date & time: 07/14/23  1138     Patient presents with: Arm Injury   Craig Green is a 83 y.o. male.   Patient is an 83 year old male who presents to the emergency department with a chief complaint of pain to the right elbow.  Patient notes that approximate 2 days ago a tree fell onto the right arm.  He notes that he has been experiencing pain, swelling since that time.  He denies any pain to the arm proximally or distally.  He denies any numbness or paresthesias.  He does admit to limited active range of motion   Arm Injury      Prior to Admission medications   Medication Sig Start Date End Date Taking? Authorizing Provider  ALPRAZolam (XANAX) 1 MG tablet Take 1 mg by mouth QID.    [provider]  aspirin 81 MG tablet Take 81 mg by mouth daily.    [provider]  atorvastatin (LIPITOR) 40 MG tablet Take 40 mg by mouth daily.    [provider]  FLUoxetine (PROZAC) 20 MG capsule Take 20 mg by mouth daily.    [provider]  HYDROcodone -acetaminophen  (NORCO) 10-325 MG per tablet Take 1 tablet by mouth every 6 (six) hours as needed.    [provider]  ofloxacin  (OCUFLOX ) 0.3 % ophthalmic solution Place 1 drop into the right eye 4 (four) times daily. 09/29/19   Avegno, Komlanvi S, FNP  omeprazole (PRILOSEC) 20 MG capsule Take 20 mg by mouth daily.    [provider]  oxyCODONE -acetaminophen  (PERCOCET/ROXICET) 5-325 MG tablet Take 1-2 tablets by mouth every 8 (eight) hours as needed for severe pain. 03/25/17   Mesner, Reymundo Caulk, MD    Allergies: Patient has no known allergies.    Review of Systems  Musculoskeletal:        Pain and swelling to right elbow  All other systems reviewed and are negative.   Updated Vital Signs BP 138/73 (BP Location: Left Arm)   Pulse 70   Temp 97.8 F (36.6 C) (Oral)   Resp 19   Ht 6' (1.829 m)    Wt 88.5 kg   SpO2 96%   BMI 26.45 kg/m   Physical Exam Vitals and nursing note reviewed.  Constitutional:      Appearance: Normal appearance.  HENT:     Head: Normocephalic and atraumatic.   Eyes:     Extraocular Movements: Extraocular movements intact.     Conjunctiva/sclera: Conjunctivae normal.     Pupils: Pupils are equal, round, and reactive to light.    Cardiovascular:     Rate and Rhythm: Normal rate and regular rhythm.     Pulses: Normal pulses.     Heart sounds: Normal heart sounds.  Pulmonary:     Effort: Pulmonary effort is normal. No respiratory distress.  Chest:     Chest wall: No tenderness.  Abdominal:     General: Abdomen is flat. Bowel sounds are normal. There is no distension.     Palpations: Abdomen is soft.     Tenderness: There is no abdominal tenderness. There is no guarding.   Musculoskeletal:        General: Normal range of motion.     Cervical back: Normal range of motion and neck supple.     Comments: Tenderness palpation noted over the right elbow diffusely, nontender palpation of right shoulder, wrist, hand, radial pulse  2+ distally, cap refill less than 2 seconds distally, sensation intact distally, radial, ulnar, median nerve function intact distally, limited active range of motion at right elbow secondary to pain and edema, no obvious deformity, no skin breakdown or ulceration, no lacerations or abrasions, no overlying erythema or warmth   Skin:    General: Skin is warm and dry.   Neurological:     General: No focal deficit present.     Mental Status: He is alert and oriented to person, place, and time. Mental status is at baseline.   Psychiatric:        Mood and Affect: Mood normal.        Behavior: Behavior normal.        Thought Content: Thought content normal.        Judgment: Judgment normal.     (all labs ordered are listed, but only abnormal results are displayed) Labs Reviewed - No data to  display  EKG: None  Radiology: No results found.   Procedures   Medications Ordered in the ED - No data to display                                  Medical Decision Making Amount and/or Complexity of Data Reviewed Radiology: ordered.   This patient presents to the ED for concern of pain and swelling to right elbow differential diagnosis includes fracture, contusion, sprain, septic joint, gout, bursitis    Additional history obtained:  Additional history obtained from wife External records from outside source obtained and reviewed including none   Imaging Studies ordered:  I ordered imaging studies including x-ray of right elbow I independently visualized and interpreted imaging which showed no acute osseous injury or lesions, enlargement of the olecranon bursa I agree with the radiologist interpretation   Medicines ordered and prescription drug management:  I ordered medication including Voltaren  gel for bursitis Reevaluation of the patient after these medicines showed that the patient improved I have reviewed the patients home medicines and have made adjustments as needed   Problem List / ED Course:  Patient is doing well at this time and is stable for discharge home.  Discussed with patient x-rays demonstrate no signs of acute osseous injury or lesions.  Suspect that he is suffering from a traumatic olecranon bursitis at this point.  He has no indication for septic joint or gout.  Do not suspect a septic olecranon bursa at this point given the recent trauma.  There was no overlying lacerations, abrasions, ulcerations or other skin lesions.  He was neurovascularly intact distally.  The need for close follow-up with orthopedics on outpatient basis for any continued symptoms was discussed as well as strict turn precautions for any new or worsening symptoms.  Patient voiced understanding and had no additional questions.   Social Determinants of Health:  None         Final diagnoses:  None    ED Discharge Orders     None          Emmalene Hare 07/14/23 1311    Early Glisson, MD 07/15/23 1349

## 2023-07-15 DIAGNOSIS — S5001XA Contusion of right elbow, initial encounter: Secondary | ICD-10-CM | POA: Diagnosis not present

## 2023-08-02 DIAGNOSIS — M549 Dorsalgia, unspecified: Secondary | ICD-10-CM | POA: Diagnosis not present

## 2023-08-02 DIAGNOSIS — Z6825 Body mass index (BMI) 25.0-25.9, adult: Secondary | ICD-10-CM | POA: Diagnosis not present

## 2023-08-22 NOTE — Progress Notes (Signed)
 08/23/2023 12:06 PM   Craig Green 1940-11-22 [id number]  Referring provider: Marvine Rush, MD [address] [place],  [place] [address]  No chief complaint on file.   HPI: 83 year old male sent by Dr. Marvine for evaluation and management of elevated PSA.  PSA from June of this year was 7.8.  Baseline PSA data from [facility] in 2016--5.5.  He also has a history of carcinoma in situ of the penis.  He was seen in 2015 by Dr. Renda at [facility] urology.  Partial penectomy was discussed with the patient.  He did not want to go this route, he went to [facility] for management.  Biopsies and laser management were performed in 2016.  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    PMH: Past Medical History:  Diagnosis Date   Anxiety    Arthritis    Back pain    Cancer (HCC)    penile   Depression    Full dentures    GERD (gastroesophageal reflux disease)    Heavy smoker (more than 20 cigarettes per day)    Hyperlipemia     Surgical History: Past Surgical History:  Procedure Laterality Date   BACK SURGERY  1986   lumb lam   CARDIAC CATHETERIZATION  2001   normal   CERVICAL FUSION  2001   FINGER ARTHRODESIS Right 04/05/2012   Procedure: RIGHT THUMB METACARPOPHALANGEAL ARTHRODESIS ;  Surgeon: Alm DELENA Hummer, MD;  Location: [facility];  Service: Orthopedics;  Laterality: Right;   LESION REMOVAL N/A 04/12/2013   Procedure: EXCISION OF ABDOMINAL WART;  Surgeon: Emery LILLETTE Blaze, MD;  Location: [facility];  Service: Urology;  Laterality: N/A;   ORIF ANKLE FRACTURE Right    PENILE BIOPSY N/A 04/12/2013   Procedure: PENILE BIOPSY AND FULGURATION;  Surgeon: Emery LILLETTE Blaze, MD;  Location: [facility];  Service: Urology;  Laterality: N/A;    Home Medications:  Allergies as of 08/23/2023   No Known Allergies      Medication List        Accurate as of August 22, 2023 12:06 PM. If you have any questions, ask  your nurse or doctor.          ALPRAZolam 1 MG tablet Commonly known as: XANAX Take 1 mg by mouth QID.   aspirin 81 MG tablet Take 81 mg by mouth daily.   atorvastatin 40 MG tablet Commonly known as: LIPITOR Take 40 mg by mouth daily.   diclofenac  Sodium 1 % Gel Commonly known as: Voltaren  Arthritis Pain Apply 2 g topically 4 (four) times daily.   FLUoxetine 20 MG capsule Commonly known as: PROZAC Take 20 mg by mouth daily.   HYDROcodone -acetaminophen  10-325 MG tablet Commonly known as: NORCO Take 1 tablet by mouth every 6 (six) hours as needed.   ofloxacin  0.3 % ophthalmic solution Commonly known as: OCUFLOX  Place 1 drop into the right eye 4 (four) times daily.   omeprazole 20 MG capsule Commonly known as: PRILOSEC Take 20 mg by mouth daily.   oxyCODONE -acetaminophen  5-325 MG tablet Commonly known as: PERCOCET/ROXICET Take 1-2 tablets by mouth every 8 (eight) hours as needed for severe pain.        Allergies: No Known Allergies  Family History: No family history on file.  Social History:  reports that he has been smoking. He has a 120 pack-year smoking history. He has never used smokeless tobacco. He reports  current alcohol use. He reports that he does not use drugs.  ROS: All other review of systems were reviewed and are negative except what is noted above in HPI  Physical Exam: There were no vitals taken for this visit.  Constitutional:  Alert and oriented, No acute distress. HEENT: [place] AT, moist mucus membranes.  Trachea midline, no masses. Cardiovascular: No clubbing, cyanosis, or edema. Respiratory: Normal respiratory effort, no increased work of breathing. GI: No inguinal hernias GU: Penis circumcised.  No significant lesions on the glans or along the penile shaft.  No scrotal lesions.  Normal anal sphincter tone.  Prostate 60 g, symmetric, nonnodular nontender.  No rectal masses. Lymph: No cervical or inguinal lymphadenopathy. Skin: No rashes,  bruises or suspicious lesions. Neurologic: Grossly intact, no focal deficits, moving all 4 extremities. Psychiatric: Normal mood and affect.  Laboratory Data: Lab Results  Component Value Date   WBC 9.7 03/25/2017   HGB 15.0 03/25/2017   HCT 44.0 03/25/2017   MCV 88.4 03/25/2017   PLT 229 03/25/2017    Lab Results  Component Value Date   CREATININE 0.90 08/07/2018    Prior records from [facility] urology and [facility]rology reviewed  Prior PSA results reviewed  Urinalysis reviewed    Assessment:  - History of carcinoma in situ of penis.  Currently, I see no significant lesions  -BPH by size, patient does have nocturia which is bothersome  -Elevated PSA 83 year old male.  Recently 7.8, 10 years ago it was 5.5.  Benign feeling gland  Plan:   - Regarding nocturia, I gave him behavioral modification instructions to limit this  -I do not think further evaluation of PSA is necessary in this 83 year-old male with a very slow PSA doubling time.  I would suggest no further PSA checking  -Return as needed There are no diagnoses linked to this encounter.  No follow-ups on file.  [name], MD  [facility] [place]

## 2023-08-23 ENCOUNTER — Ambulatory Visit: Admitting: Urology

## 2023-08-23 VITALS — BP 145/84 | HR 69

## 2023-08-23 DIAGNOSIS — R972 Elevated prostate specific antigen [PSA]: Secondary | ICD-10-CM | POA: Diagnosis not present

## 2023-08-23 DIAGNOSIS — N401 Enlarged prostate with lower urinary tract symptoms: Secondary | ICD-10-CM

## 2023-08-23 DIAGNOSIS — D074 Carcinoma in situ of penis: Secondary | ICD-10-CM

## 2023-08-23 DIAGNOSIS — R351 Nocturia: Secondary | ICD-10-CM | POA: Diagnosis not present

## 2023-08-23 DIAGNOSIS — Z8549 Personal history of malignant neoplasm of other male genital organs: Secondary | ICD-10-CM | POA: Diagnosis not present

## 2023-08-24 LAB — MICROSCOPIC EXAMINATION
Bacteria, UA: NONE SEEN
WBC, UA: NONE SEEN /HPF (ref 0–5)

## 2023-08-24 LAB — URINALYSIS, ROUTINE W REFLEX MICROSCOPIC
Bilirubin, UA: NEGATIVE
Glucose, UA: NEGATIVE
Ketones, UA: NEGATIVE
Leukocytes,UA: NEGATIVE
Nitrite, UA: NEGATIVE
Protein,UA: NEGATIVE
Specific Gravity, UA: 1.03 (ref 1.005–1.030)
Urobilinogen, Ur: 1 mg/dL (ref 0.2–1.0)
pH, UA: 5.5 (ref 5.0–7.5)

## 2023-09-08 DIAGNOSIS — M549 Dorsalgia, unspecified: Secondary | ICD-10-CM | POA: Diagnosis not present

## 2023-09-08 DIAGNOSIS — Z6825 Body mass index (BMI) 25.0-25.9, adult: Secondary | ICD-10-CM | POA: Diagnosis not present

## 2023-09-08 DIAGNOSIS — F419 Anxiety disorder, unspecified: Secondary | ICD-10-CM | POA: Diagnosis not present

## 2023-09-08 DIAGNOSIS — R252 Cramp and spasm: Secondary | ICD-10-CM | POA: Diagnosis not present

## 2023-09-08 DIAGNOSIS — M5134 Other intervertebral disc degeneration, thoracic region: Secondary | ICD-10-CM | POA: Diagnosis not present

## 2023-10-06 DIAGNOSIS — M549 Dorsalgia, unspecified: Secondary | ICD-10-CM | POA: Diagnosis not present

## 2023-10-06 DIAGNOSIS — Z6825 Body mass index (BMI) 25.0-25.9, adult: Secondary | ICD-10-CM | POA: Diagnosis not present

## 2023-10-06 DIAGNOSIS — F419 Anxiety disorder, unspecified: Secondary | ICD-10-CM | POA: Diagnosis not present

## 2023-10-06 DIAGNOSIS — L089 Local infection of the skin and subcutaneous tissue, unspecified: Secondary | ICD-10-CM | POA: Diagnosis not present

## 2023-11-07 DIAGNOSIS — Z7689 Persons encountering health services in other specified circumstances: Secondary | ICD-10-CM | POA: Diagnosis not present

## 2023-11-07 DIAGNOSIS — R030 Elevated blood-pressure reading, without diagnosis of hypertension: Secondary | ICD-10-CM | POA: Diagnosis not present

## 2023-11-07 DIAGNOSIS — E782 Mixed hyperlipidemia: Secondary | ICD-10-CM | POA: Diagnosis not present

## 2023-11-07 DIAGNOSIS — K219 Gastro-esophageal reflux disease without esophagitis: Secondary | ICD-10-CM | POA: Diagnosis not present

## 2023-11-07 DIAGNOSIS — R300 Dysuria: Secondary | ICD-10-CM | POA: Diagnosis not present

## 2023-11-07 DIAGNOSIS — M549 Dorsalgia, unspecified: Secondary | ICD-10-CM | POA: Diagnosis not present

## 2023-11-07 DIAGNOSIS — G8929 Other chronic pain: Secondary | ICD-10-CM | POA: Diagnosis not present

## 2023-11-07 DIAGNOSIS — R252 Cramp and spasm: Secondary | ICD-10-CM | POA: Diagnosis not present

## 2023-11-07 DIAGNOSIS — F419 Anxiety disorder, unspecified: Secondary | ICD-10-CM | POA: Diagnosis not present

## 2023-12-14 DIAGNOSIS — R300 Dysuria: Secondary | ICD-10-CM | POA: Diagnosis not present

## 2023-12-14 DIAGNOSIS — E782 Mixed hyperlipidemia: Secondary | ICD-10-CM | POA: Diagnosis not present

## 2023-12-14 DIAGNOSIS — R252 Cramp and spasm: Secondary | ICD-10-CM | POA: Diagnosis not present

## 2023-12-20 ENCOUNTER — Ambulatory Visit (HOSPITAL_COMMUNITY)
Admission: RE | Admit: 2023-12-20 | Discharge: 2023-12-20 | Disposition: A | Discharge status: Home / Self Care | Source: Ambulatory Visit

## 2023-12-20 ENCOUNTER — Other Ambulatory Visit (HOSPITAL_COMMUNITY): Payer: Self-pay

## 2023-12-20 DIAGNOSIS — R051 Acute cough: Secondary | ICD-10-CM | POA: Diagnosis not present

## 2023-12-20 DIAGNOSIS — Z720 Tobacco use: Secondary | ICD-10-CM | POA: Diagnosis not present

## 2023-12-20 DIAGNOSIS — M549 Dorsalgia, unspecified: Secondary | ICD-10-CM | POA: Diagnosis not present

## 2023-12-20 DIAGNOSIS — E782 Mixed hyperlipidemia: Secondary | ICD-10-CM | POA: Diagnosis not present

## 2023-12-20 DIAGNOSIS — R059 Cough, unspecified: Secondary | ICD-10-CM | POA: Diagnosis not present

## 2023-12-20 DIAGNOSIS — G8929 Other chronic pain: Secondary | ICD-10-CM | POA: Diagnosis not present

## 2023-12-20 DIAGNOSIS — F419 Anxiety disorder, unspecified: Secondary | ICD-10-CM | POA: Diagnosis not present

## 2024-02-09 ENCOUNTER — Encounter (HOSPITAL_COMMUNITY): Payer: Self-pay

## 2024-02-09 ENCOUNTER — Emergency Department (HOSPITAL_COMMUNITY)

## 2024-02-09 ENCOUNTER — Other Ambulatory Visit: Payer: Self-pay

## 2024-02-09 ENCOUNTER — Emergency Department (HOSPITAL_COMMUNITY): Admission: EM | Admit: 2024-02-09 | Discharge: 2024-02-10 | Disposition: A | Discharge status: Home / Self Care

## 2024-02-09 DIAGNOSIS — R059 Cough, unspecified: Secondary | ICD-10-CM | POA: Diagnosis present

## 2024-02-09 DIAGNOSIS — J101 Influenza due to other identified influenza virus with other respiratory manifestations: Secondary | ICD-10-CM | POA: Diagnosis not present

## 2024-02-09 DIAGNOSIS — I6782 Cerebral ischemia: Secondary | ICD-10-CM | POA: Insufficient documentation

## 2024-02-09 DIAGNOSIS — W19XXXA Unspecified fall, initial encounter: Secondary | ICD-10-CM | POA: Diagnosis not present

## 2024-02-09 DIAGNOSIS — N390 Urinary tract infection, site not specified: Secondary | ICD-10-CM | POA: Diagnosis not present

## 2024-02-09 DIAGNOSIS — Z7982 Long term (current) use of aspirin: Secondary | ICD-10-CM | POA: Insufficient documentation

## 2024-02-09 LAB — CBC WITH DIFFERENTIAL/PLATELET
Abs Immature Granulocytes: 0.05 K/uL (ref 0.00–0.07)
Basophils Absolute: 0 K/uL (ref 0.0–0.1)
Basophils Relative: 0 %
Eosinophils Absolute: 0 K/uL (ref 0.0–0.5)
Eosinophils Relative: 0 %
HCT: 37.5 % — ABNORMAL LOW (ref 39.0–52.0)
Hemoglobin: 12.5 g/dL — ABNORMAL LOW (ref 13.0–17.0)
Immature Granulocytes: 1 %
Lymphocytes Relative: 10 %
Lymphs Abs: 0.9 K/uL (ref 0.7–4.0)
MCH: 29.1 pg (ref 26.0–34.0)
MCHC: 33.3 g/dL (ref 30.0–36.0)
MCV: 87.2 fL (ref 80.0–100.0)
Monocytes Absolute: 1.1 K/uL — ABNORMAL HIGH (ref 0.1–1.0)
Monocytes Relative: 12 %
Neutro Abs: 7.3 K/uL (ref 1.7–7.7)
Neutrophils Relative %: 77 %
Platelets: 209 K/uL (ref 150–400)
RBC: 4.3 MIL/uL (ref 4.22–5.81)
RDW: 13.2 % (ref 11.5–15.5)
WBC: 9.4 K/uL (ref 4.0–10.5)
nRBC: 0 % (ref 0.0–0.2)

## 2024-02-09 NOTE — ED Provider Notes (Signed)
 " [place] EMERGENCY DEPARTMENT AT [facility] Provider Note   CSN: [id number] Arrival date & time: 02/09/24  2201     Patient presents with: Craig Green is a 84 y.o. male.  {Add pertinent medical, surgical, social history, OB history to HPI:4370} 84 year old male presents for evaluation of fall.  He is a poor historian.  Family at bedside helps write history.  They state that he has not been himself lately and been more weak than usual.  States he has had a productive cough for a while but has gotten much worse over the last few days.  States he has not been eating or drinking much either.  Patient had a fall today, did not hit his head or lose consciousness.  He is not on any blood thinners any states he has back pain but that is chronic.  Denies any other symptoms or concerns.   Fall Pertinent negatives include no chest pain, no abdominal pain and no shortness of breath.       Prior to Admission medications  Medication Sig Start Date End Date Taking? Authorizing Provider  ALPRAZolam (XANAX) 1 MG tablet Take 1 mg by mouth QID.    [provider]  aspirin 81 MG tablet Take 81 mg by mouth daily.    [provider]  atorvastatin (LIPITOR) 40 MG tablet Take 40 mg by mouth daily.    [provider]  diclofenac  Sodium (VOLTAREN  ARTHRITIS PAIN) 1 % GEL Apply 2 g topically 4 (four) times daily. 07/14/23   Daralene Lonni BIRCH, PA-C  FLUoxetine (PROZAC) 20 MG capsule Take 20 mg by mouth daily.    [provider]  HYDROcodone -acetaminophen  (NORCO) 10-325 MG per tablet Take 1 tablet by mouth every 6 (six) hours as needed.    [provider]  ofloxacin  (OCUFLOX ) 0.3 % ophthalmic solution Place 1 drop into the right eye 4 (four) times daily. 09/29/19   Avegno, Komlanvi S, FNP  omeprazole (PRILOSEC) 20 MG capsule Take 20 mg by mouth daily.    [provider]  oxyCODONE -acetaminophen  (PERCOCET/ROXICET) 5-325 MG tablet  Take 1-2 tablets by mouth every 8 (eight) hours as needed for severe pain. 03/25/17   Mesner, Selinda, MD    Allergies: Patient has no known allergies.    Review of Systems  Constitutional:  Positive for fatigue. Negative for chills and fever.  HENT:  Negative for ear pain and sore throat.   Eyes:  Negative for pain and visual disturbance.  Respiratory:  Negative for cough and shortness of breath.   Cardiovascular:  Negative for chest pain and palpitations.  Gastrointestinal:  Negative for abdominal pain and vomiting.  Genitourinary:  Negative for dysuria and hematuria.  Musculoskeletal:  Positive for back pain. Negative for arthralgias.  Skin:  Negative for color change and rash.  Neurological:  Negative for seizures and syncope.  All other systems reviewed and are negative.   Updated Vital Signs BP 106/66   Pulse 80   Temp 99.7 F (37.6 C) (Oral)   Resp 18   Ht 6' (1.829 m)   Wt 86.2 kg   SpO2 93%   BMI 25.77 kg/m   Physical Exam Vitals and nursing note reviewed.  Constitutional:      General: He is not in acute distress.    Appearance: Normal appearance. He is well-developed. He is not ill-appearing.  HENT:     Head: Normocephalic and atraumatic.  Eyes:     Conjunctiva/sclera: Conjunctivae normal.  Cardiovascular:     Rate and Rhythm: Normal rate and regular rhythm.     Heart sounds: No murmur heard. Pulmonary:     Effort: Pulmonary effort is normal. No respiratory distress.     Breath sounds: Normal breath sounds.  Abdominal:     Palpations: Abdomen is soft.     Tenderness: There is no abdominal tenderness.  Musculoskeletal:        General: No swelling.     Cervical back: Neck supple.  Skin:    General: Skin is warm and dry.     Capillary Refill: Capillary refill takes less than 2 seconds.  Neurological:     General: No focal deficit present.     Mental Status: He is alert.  Psychiatric:        Mood and Affect: Mood normal.     (all labs ordered are  listed, but only abnormal results are displayed) Labs Reviewed  CBC WITH DIFFERENTIAL/PLATELET - Abnormal; Notable for the following components:      Result Value   Hemoglobin 12.5 (*)    HCT 37.5 (*)    Monocytes Absolute 1.1 (*)    All other components within normal limits  RESP PANEL BY RT-PCR (RSV, FLU A&B, COVID)  [id number]  BASIC METABOLIC PANEL WITH GFR  URINALYSIS, W/ REFLEX TO CULTURE (INFECTION SUSPECTED)  TROPONIN T, HIGH SENSITIVITY    EKG: None  Radiology: No results found.  {Document cardiac monitor, telemetry assessment procedure when appropriate:32947} Procedures   Medications Ordered in the ED - No data to display    {Click here for ABCD2, HEART and other calculators REFRESH Note before signing:1}                              Medical Decision Making Amount and/or Complexity of Data Reviewed Labs: ordered. Radiology: ordered.   ***  {Document critical care time when appropriate  Document review of labs and clinical decision tools ie CHADS2VASC2, etc  Document your independent review of radiology images and any outside records  Document your discussion with family members, caretakers and with consultants  Document social determinants of health affecting pt's care  Document your decision making why or why not admission, treatments were needed:32947:::1}   Final diagnoses:  None    ED Discharge Orders     None        "

## 2024-02-09 NOTE — ED Triage Notes (Signed)
 Pt bib EMS from home. Family states he had a fall and has not been acting the same since about 11:45 am. Pt reports having a cold for 3 months and taking antibiotics. EMS reports fever with a temperature of 100.3.

## 2024-02-09 NOTE — ED Triage Notes (Signed)
 Pt complaining of chronic back pain and not being able to balance.

## 2024-02-10 ENCOUNTER — Emergency Department (HOSPITAL_COMMUNITY)

## 2024-02-10 LAB — URINALYSIS, W/ REFLEX TO CULTURE (INFECTION SUSPECTED)
Bilirubin Urine: NEGATIVE
Glucose, UA: NEGATIVE mg/dL
Hgb urine dipstick: NEGATIVE
Ketones, ur: 5 mg/dL — AB
Leukocytes,Ua: NEGATIVE
Nitrite: NEGATIVE
Protein, ur: NEGATIVE mg/dL
Specific Gravity, Urine: 1.019 (ref 1.005–1.030)
pH: 5 (ref 5.0–8.0)

## 2024-02-10 LAB — TROPONIN T, HIGH SENSITIVITY: Troponin T High Sensitivity: 19 ng/L (ref 0–19)

## 2024-02-10 LAB — RESP PANEL BY RT-PCR (RSV, FLU A&B, COVID)  RVPGX2
Influenza A by PCR: POSITIVE — AB
Influenza B by PCR: NEGATIVE
Resp Syncytial Virus by PCR: NEGATIVE
SARS Coronavirus 2 by RT PCR: NEGATIVE

## 2024-02-10 LAB — BASIC METABOLIC PANEL WITH GFR
Anion gap: 13 (ref 5–15)
BUN: 20 mg/dL (ref 8–23)
CO2: 26 mmol/L (ref 22–32)
Calcium: 8.9 mg/dL (ref 8.9–10.3)
Chloride: 95 mmol/L — ABNORMAL LOW (ref 98–111)
Creatinine, Ser: 0.88 mg/dL (ref 0.61–1.24)
GFR, Estimated: >60 mL/min
Glucose, Bld: 103 mg/dL — ABNORMAL HIGH (ref 70–99)
Potassium: 3.9 mmol/L (ref 3.5–5.1)
Sodium: 133 mmol/L — ABNORMAL LOW (ref 135–145)

## 2024-02-10 MED ORDER — CEPHALEXIN 500 MG PO CAPS
500.0000 mg | ORAL_CAPSULE | Freq: Once | ORAL | Status: AC
  Administered 2024-02-10: 500 mg via ORAL
  Filled 2024-02-10: qty 1

## 2024-02-10 MED ORDER — CEPHALEXIN 500 MG PO CAPS: 500.0000 mg | ORAL_CAPSULE | Freq: Four times a day (QID) | ORAL | 0 refills | Status: AC

## 2024-02-10 NOTE — Discharge Instructions (Addendum)
 Take your Keflex  as prescribed.  You can use any over-the-counter medications as needed for your symptoms for flu.  You can follow-up the CT scan results on your MyChart app and it is important that you follow-up with primary care in 1 to 2 weeks to discuss your CT scan results and for reevaluation.  If it is positive for pneumonia and additional antibiotic will be sent to the pharmacy for you to pick up tomorrow.  Return to the ER for any new or worsening symptoms.
